# Patient Record
Sex: Male | Born: 1960 | Race: White | Hispanic: No | Marital: Single | Smoking: Never smoker
Health system: Southern US, Community
[De-identification: ages and names within clinical notes are randomized; demographics above are authoritative.]

## PROBLEM LIST (undated history)

## (undated) DIAGNOSIS — M1990 Unspecified osteoarthritis, unspecified site: Secondary | ICD-10-CM

## (undated) DIAGNOSIS — K649 Unspecified hemorrhoids: Secondary | ICD-10-CM

## (undated) DIAGNOSIS — T7840XA Allergy, unspecified, initial encounter: Secondary | ICD-10-CM

## (undated) HISTORY — DX: Unspecified osteoarthritis, unspecified site: M19.90

## (undated) HISTORY — DX: Allergy, unspecified, initial encounter: T78.40XA

---

## 2002-03-07 ENCOUNTER — Ambulatory Visit (HOSPITAL_COMMUNITY): Admission: RE | Admit: 2002-03-07 | Discharge: 2002-03-07 | Payer: Self-pay | Admitting: Orthopedic Surgery

## 2002-06-20 ENCOUNTER — Ambulatory Visit (HOSPITAL_COMMUNITY): Admission: RE | Admit: 2002-06-20 | Discharge: 2002-06-20 | Payer: Self-pay | Admitting: Orthopedic Surgery

## 2004-09-09 ENCOUNTER — Ambulatory Visit (HOSPITAL_COMMUNITY): Admission: RE | Admit: 2004-09-09 | Discharge: 2004-09-09 | Payer: Self-pay | Admitting: Family Medicine

## 2009-12-08 ENCOUNTER — Emergency Department (HOSPITAL_COMMUNITY): Admission: EM | Admit: 2009-12-08 | Discharge: 2009-12-08 | Payer: Self-pay | Admitting: Emergency Medicine

## 2011-05-01 NOTE — Op Note (Signed)
[facility]  Patient:    Jared Knight, Jared Knight Visit Number: [id number] MRN: [id number]          Service Type: DSU Location: DAY Attending Physician:  Marlowe Kays Page Dictated by:   Illene Labrador. Aplington, M.D. Proc. Date: 03/07/02 Admit Date:  03/07/2002                             Operative Report  PREOPERATIVE DIAGNOSIS:  Persistent left shoulder pain, followup post left shoulder arthroscopy with arthroscopic subacromial decompression and resection of distal left clavicle.  POSTOPERATIVE DIAGNOSIS:  Persistent left shoulder pain, followup post left shoulder arthroscopy with arthroscopic subacromial decompression and resection of distal left clavicle.  OPERATION: 1. Left shoulder arthroscopy (normal exam). 2. Repeat arthroscopic subacromial decompression. 3. Open resection of small portion of distal clavicle relieving clavicular    acromial impingement.  SURGEON:  Illene Labrador. Aplington, M.D.  ASSISTANTOttie Glazier. Wynona Neat, P.A.-C.  ANESTHESIA:  General.  PATHOLOGY AND JUSTIFICATION FOR PROCEDURE:  The patient had left shoulder elsewhere in September of 2002. He has had persistent pain in the shoulder which seems to be mainly localized to the [facility] joint and seems to be related to what I feel is an inadequate resection of the distal clavicle arthroscopically with impingement of the residual clavicle on the acromion. He also has some loss of internal rotation with some pain which I thought might possibly be due to some residual impingement. Accordingly, he is here for the above-mentioned surgery. At surgery, we found a good bit of scar in the subacromial area which I resected. I did not have to perform any additional bone resection. There was impingement of the posterior portion of the distal clavicle on the acromion and once this was resected, his impingement problem was relieved.  DESCRIPTION OF PROCEDURE:  After satisfactory general anesthesia, in  a semisitting position on the Schlein frame, the left shoulder girdle is prepped with DuraPrep and draped in a sterile field. The anatomy of the shoulder joint was marked out and lateral and posterior portals with subacromial space were infiltrated with 0.5% Marcaine with adrenaline was was the subacromial space. Through a posterior soft spot portal, I was able, with some difficulty because of scar, using a blunt trocar, to enter the glenohumeral joint, which was normal on examination; representative pictures were taken. I then redirected the scope from the subacromial area and through a lateral portal, introduced a blunt cannula followed by a 4.2 shaver, and began removing extensive fibrous tissue. I then brought in the ArthroCare vaporizer and removed additional soft tissue from around the perimeter of the anterior acromion and on the underlying surface of the acromion until I felt secure that his impingement problem had been relieved as evidenced by wide clearance on abduction of the arm. I then made an open incision over the distal clavicle and exposed subperiosteally the distal clavicle and the adjacent acromion. By bringing the arm across the chest, we could see impingement of the distal clavicle on the acromion. I protected the underneath surface of the distal clavicle. I was able to resect a small triangular portion of the distal clavicle that completely relieved the abutment of the distal clavicle on the acromion when bringing his arm across the chest. I felt then that we had concluded the operation. The wound was irrigated with sterile saline. Gelfoam was placed in the interval between the residual clavicle and the acromion. The wound was infiltrated  with 0.5% Marcaine with adrenaline. The fascia was reapproximated with interrupted #1 Vicryl, subcutaneous tissue with 2-0 Vicryl, with staples in the skin incision and in the portals. A Betadine Adaptic dry sterile dressing and a  large sling were applied. He tolerated the procedure well and was returned to recovery in satisfactory condition with no known complications. Dictated by:   Illene Labrador. Aplington, M.D. Attending Physician:  Joaquin Courts DD:  03/07/02 TD:  03/08/02 Job: [id number]

## 2011-05-01 NOTE — Op Note (Signed)
[facility]  Patient:    Jared Knight, Jared Knight Visit Number: [id number] MRN: [id number]          Service Type: DSU Location: DAY Attending Physician:  Marlowe Kays Page Dictated by:   Illene Labrador. Aplington, M.D. Proc. Date: 06/20/02 Admit Date:  06/20/2002 Discharge Date: 06/20/2002                             Operative Report  PREOPERATIVE DIAGNOSES:  Persistent right shoulder pain status post arthroscopic subacromial decompression and resection of the distal clavicle.  POSTOPERATIVE DIAGNOSES:  Persistent right shoulder pain status post arthroscopic subacromial decompression and resection of the distal clavicle.  OPERATION PERFORMED: 1. Right shoulder arthroscopy (normal examination). 2. Arthroscopic subacromial decompression. 3. Open revision of prior distal clavicle resection.  SURGEON:  Illene Labrador. Aplington, M.D.  ASSISTANT:  Clarene Reamer, P.A.-C  ANESTHESIA:  General.  PATHOLOGY AND JUSTIFICATION FOR PROCEDURE:  He had had similar procedures performed in both shoulders elsewhere. A few months ago, I revised his left shoulder with evaluation of the glenohumeral joint which was normal, repeating the subacromial decompression arthroscopically and resecting some residual distal clavicle because he had a residual impingement problem. He has done well following this left shoulder surgery. He is having a similar type of pain in his right shoulder. On a plain x-ray, it appeared that the clavicle might be riding slightly high and that he might be having continued AC joint impingement problem. In surgery, his glenohumeral joint looked normal. I did perform some additional subacromial decompression which may have been a contributing factor. On exploration of the [facility] joint, the clavicle did not appear to be elevated upward and did appear to be stable. He had some fibrous tissue in the gap from a previous resection some of which was quite firm but I did  not have to resect any additional bone from the distal clavicle. There was a sharp point to the acromion which I did smooth down with an rongeur.  DESCRIPTION OF PROCEDURE:  Satisfactory general anesthesia, beach chair position on the Schlein frame, the right shoulder girdle was prepped with Duraprep, draped in a sterile field. The anatomy of the shoulder joint was marked out and the lateral and posterior soft spots in the subacromial space were infiltrated with 0.5% Marcaine with adrenaline and I also infiltrated the proposed open AC joint incision site. Through the posterior soft spot, I was able to atraumatically enter the glenohumeral joint and found a normal looking joint with documentary pictures being taken. I then redirected the scope in the subacromial area and through a lateral portal introduced a blunt cannula followed by a 4.2 shaver and removed a small amount of fibrous tissue which was present. In general, the previous decompression appeared to be satisfactory but there were some areas that I did feel might be creating an impingement problem. I then used the arthroscope vaporizer removing soft tissue on the underneath surface of the acromion and then used a 4-0 oval bur to further bur down the underneath surface of the acromion with documented pictures showing wide decompression at the conclusion of this. I then drained fluid from the [facility] joint and opened the wound at the distal clavicle resection site. The distal clavicle was identified with a combination of cautery and periosteal dissection. I also identified the adjacent acromion. There was some thick fibrous almost bony type tissue which I removed and I smoothed down the distal acromion which  had a sharp area but I could place my index finger easily in the gap performed by the previous resection and documented pictures were taken. I then brought his arm across his chest and found no evidence for any impingement on doing  this either. Accordingly, the wound was well irrigated with sterile saline and Gelfoam was placed in the resection site gap and the fascia closed with interrupted #1 Vicryl, subcutaneous tissue with 2-0 Vicryl, Steri-Strips on the skin and 4-0 nylon in the two portals. A dry sterile dressing and sling were applied. The patient tolerated the procedure well and was taken to the recovery room in satisfactory condition with no known complications. Dictated by:   Illene Labrador. Aplington, M.D. Attending Physician:  Joaquin Courts DD:  06/20/02 TD:  06/23/02 Job: [id number]

## 2013-12-10 ENCOUNTER — Ambulatory Visit: Payer: Self-pay | Admitting: Emergency Medicine

## 2013-12-10 VITALS — BP 120/72 | HR 84 | Temp 98.7°F | Resp 16 | Ht 72.5 in | Wt 225.0 lb

## 2013-12-10 DIAGNOSIS — J209 Acute bronchitis, unspecified: Secondary | ICD-10-CM

## 2013-12-10 MED ORDER — PROMETHAZINE-CODEINE 6.25-10 MG/5ML PO SYRP: 5.0000 mL | ORAL_SOLUTION | Freq: Four times a day (QID) | ORAL | Status: DC | PRN

## 2013-12-10 MED ORDER — AZITHROMYCIN 250 MG PO TABS: ORAL_TABLET | ORAL | Status: DC

## 2013-12-10 NOTE — Patient Instructions (Signed)

## 2013-12-10 NOTE — Progress Notes (Signed)
[facility] [address] [place] [address] [phone]  Date:  12/10/2013   Name:  Jared Knight   DOB:  01-05-1961   MRN:  [id number]  PCP:  No PCP Per Patient    Chief Complaint: Fever, Headache, Sore Throat, Chest Congestion, Cough and Chills   History of Present Illness:  Jared Knight is a 52 y.o. very pleasant male patient who presents with the following:  Has cough productive purulent sputum.  Has fever three days with temp 103.  No wheezing or shortness of breath.  No nausea and vomiting.  No stool change.  Some moderation with OTC medications.  No improvement with over the counter medications or other home remedies.  Denies other complaint or health concern today.   There are no active problems to display for this patient.   Past Medical History  Diagnosis Date  . Allergy   . Arthritis     History reviewed. No pertinent past surgical history.  History  Substance Use Topics  . Smoking status: Never Smoker   . Smokeless tobacco: Not on file  . Alcohol Use: No    History reviewed. No pertinent family history.  No Known Allergies  Medication list has been reviewed and updated.  No current outpatient prescriptions on file prior to visit.   No current facility-administered medications on file prior to visit.    Review of Systems:  As per HPI, otherwise negative.    Physical Examination: Filed Vitals:   12/10/13 1505  BP: 120/72  Pulse: 84  Temp: 98.7 F (37.1 C)  Resp: 16   Filed Vitals:   12/10/13 1505  Height: 6' 0.5" (1.842 m)  Weight: 225 lb (102.059 kg)   Body mass index is 30.08 kg/(m^2). Ideal Body Weight: Weight in (lb) to have BMI = 25: 186.5  GEN: WDWN, NAD, Non-toxic, A & O x 3 HEENT: Atraumatic, Normocephalic. Neck supple. No masses, No LAD. Ears and Nose: No external deformity. CV: RRR, No M/G/R. No JVD. No thrill. No extra heart sounds. PULM: CTA B, no wheezes, crackles, rhonchi. No retractions. No resp.  distress. No accessory muscle use. ABD: S, NT, ND, +BS. No rebound. No HSM. EXTR: No c/c/e NEURO Normal gait.  PSYCH: Normally interactive. Conversant. Not depressed or anxious appearing.  Calm demeanor.    Assessment and Plan: Bronchitis zpak Phen c cod  Signed,  Phillips Odor, MD

## 2018-12-28 ENCOUNTER — Telehealth (INDEPENDENT_AMBULATORY_CARE_PROVIDER_SITE_OTHER): Payer: Self-pay | Admitting: Orthopedic Surgery

## 2018-12-28 ENCOUNTER — Ambulatory Visit (INDEPENDENT_AMBULATORY_CARE_PROVIDER_SITE_OTHER): Payer: Self-pay

## 2018-12-28 ENCOUNTER — Ambulatory Visit (INDEPENDENT_AMBULATORY_CARE_PROVIDER_SITE_OTHER): Payer: BLUE CROSS/BLUE SHIELD | Admitting: Orthopedic Surgery

## 2018-12-28 DIAGNOSIS — R102 Pelvic and perineal pain: Secondary | ICD-10-CM

## 2018-12-28 NOTE — Telephone Encounter (Signed)
Patient stated that he know you all ran out of time during his appt but requesting a gel injection to be done.  Please call patient to advise.  [phone]

## 2018-12-28 NOTE — Telephone Encounter (Signed)
Please advise and we can check benefits/auth needed.

## 2018-12-29 NOTE — Telephone Encounter (Signed)
Ok for that pls caslasl thx

## 2018-12-30 ENCOUNTER — Encounter (INDEPENDENT_AMBULATORY_CARE_PROVIDER_SITE_OTHER): Payer: Self-pay | Admitting: Orthopedic Surgery

## 2018-12-30 NOTE — Telephone Encounter (Signed)
Ok for Gel injection.  Please start process. IC patient and LM advising that we are checking VOB for him.

## 2018-12-30 NOTE — Telephone Encounter (Signed)
Noted  

## 2018-12-30 NOTE — Progress Notes (Signed)
Office Visit Note   Patient: Jared PoundsGary P Knight           Date of Birth: 1961/10/11           MRN: [id number] Visit Date: 12/28/2018 Requested by: No referring provider defined for this encounter. PCP: Patient, No Pcp Per  Subjective: Chief Complaint  Patient presents with  . Left Leg - Pain    HPI: Jared Knight is a patient with multiple and complex orthopedic issues today.  Primary among them is his concern about leg length discrepancy.  Reviewed multiple MRI and plain radiographs from prior treating physicians.  Also looked at old clinic notes.  Patient states that he had knee surgery 1988.  Has had generally bad pain in that left knee since that time.  He had plantar fasciitis in 2012 and did the normal nonoperative therapies for that.  Developed low back pain later that year and was told that he had scoliosis and only one good disc.  Really had no back problems before that.  Part of that work-up demonstrated what was thought to be a leg length discrepancy and he was given a 3/4 inch shoe lift in 2012 for that problem.  He subsequently saw a different physician in the practice and was told that he did not have a leg length discrepancy.  Also saw foot surgeon in the practice who stated that no lift was required.  He also has issues with the left knee.  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  Also reviewed the MRI scan of the left knee which shows degenerative type medial meniscal fraying but fairly significant medial compartment arthritis.  Also reviewed the MRI scan of his back which does show degenerative disc disease throughout the lumbar spine with 1 reasonably looking disc at L5-S1.              ROS: All systems reviewed are negative as they relate to the chief complaint within the history of present illness.  Patient denies  fevers or chills.   Assessment & Plan: Visit Diagnoses:  1. Pelvic pain     Plan: Impression is pelvic tilt  without definitive leg length discrepancy based on measurements and visual approximation of his standing pelvic height both with and without the shoe lift.  I think the plan on that would be a progressive diminishing effective shoe lift on the left-hand side.  The most economical way to do that would be to put a 3 essential lift on his right-hand side which would effectively cut in half the leg length on the left.  He could use that for about 6 to 8 weeks and then get a normal lift.  I did watch him walk without either Shuvon and he does not have much in terms of the limp I would expect if he had any significant leg length inequality.  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  More symptomatic and potentially problematic will be that left knee medial compartment arthritis and mildly degenerative meniscal tear.  I think most of his pain is coming from that arthritis.  He has recurrent effusion that is been aspirated on multiple occasions without much sustained relief.  I think he has to decide if he wants to get arthroscopy to address the meniscal pathology.  Eventually he may need partial knee replacement.  He wants to consider his options  on that left knee.  Plan for the brace and shoe lift was discussed.  All in all we spent about an hour talking about all these problems.  I will see him back as needed.  Follow-Up Instructions: Return if symptoms worsen or fail to improve.   Orders:  Orders Placed This Encounter  Procedures  . XR Pelvis 1-2 Views   No orders of the defined types were placed in this encounter.     Procedures: No procedures performed   Clinical Data: No additional findings.  Objective: Vital Signs: There were no vitals taken for this visit.  Physical Exam:   Constitutional: Patient appears well-developed HEENT:  Head: Normocephalic Eyes:EOM are normal Neck: Normal range of  motion Cardiovascular: Normal rate Pulmonary/chest: Effort normal Neurologic: Patient is alert Skin: Skin is warm Psychiatric: Patient has normal mood and affect    Ortho Exam: Ortho exam demonstrates pretty normal gait alignment when walking without the shoe lift.  With the shoe lift it looks about the same but may have a little bit more of a antalgic gait with the left.  Examined from behind pelvic tilt both with and without the shoe lift in the left side is definitely higher by about a centimeter with the shoe lift in place on the left.  No groin pain with internal X rotation of the leg.  Pedal pulses palpable.  He does have an effusion in the left knee which is moderate no effusion in the right knee.  Collateral crucial ligaments are stable bilaterally.  No nerve root tension signs.  Pedal pulses are palpable.  No other masses lymphadenopathy or skin changes noted in the hip or back region.  Specialty Comments:  No specialty comments available.  Imaging: No results found.   PMFS History: There are no active problems to display for this patient.  Past Medical History:  Diagnosis Date  . Allergy   . Arthritis     History reviewed. No pertinent family history.  History reviewed. No pertinent surgical history. Social History   Occupational History  . Not on file  Tobacco Use  . Smoking status: Never Smoker  Substance and Sexual Activity  . Alcohol use: No  . Drug use: No  . Sexual activity: Not on file

## 2019-01-04 ENCOUNTER — Telehealth (INDEPENDENT_AMBULATORY_CARE_PROVIDER_SITE_OTHER): Payer: Self-pay

## 2019-01-04 NOTE — Telephone Encounter (Signed)
Submitted VOB for SynviscOne, left knee. 

## 2019-01-18 ENCOUNTER — Telehealth (INDEPENDENT_AMBULATORY_CARE_PROVIDER_SITE_OTHER): Payer: Self-pay

## 2019-01-18 NOTE — Telephone Encounter (Signed)
PA required for SynviscOne, left knee. Faxed completed PA form to [employer] at [phone]. 

## 2019-01-20 ENCOUNTER — Telehealth (INDEPENDENT_AMBULATORY_CARE_PROVIDER_SITE_OTHER): Payer: Self-pay

## 2019-01-20 NOTE — Telephone Encounter (Signed)
Talked with patient and advised him that he is approved for gel injection.  Approved for SynviscOne, left knee Buy & Bill Covered at 100% through his insurance. May have a Co-pay? PA required PA Approval# [id number] Total Approved- 48.00 units Valid 01/18/2019- 01/18/2020  Appt.01/30/2019 with Dr. August Saucer

## 2019-01-20 NOTE — Telephone Encounter (Signed)
Patient has a couple of questions.  1. Would like to know if the meniscal tear needs to be addressed and if so, when?    2.Patient would also like to know if the Meniscal tear will heal itself or will the gel injection help heal the tear?    3. Will the gel injection take care of the fluid not coming back?  CB# is [phone].  Please advise.  Thank you.

## 2019-01-23 NOTE — Telephone Encounter (Signed)
I called pt and advised of message below. He has an appt 01/30/2019 and will discuss at appt.

## 2019-01-23 NOTE — Telephone Encounter (Signed)
100 percent the fluid will come back

## 2019-01-23 NOTE — Telephone Encounter (Signed)
As we discussed last office visit he has 2 pain generators in the knee one is the significant amount of medial compartment arthritis the other is that meniscal tear.  Whether or not that meniscal tear needs to be addressed really depends on whether or not he wants an intervention.  Other intervention option would be partial knee replacement.  I would not do that yet.  I think you could consider doing arthroscopy with partial medial meniscectomy with the understanding that it may not help and it may only buy him 1 or 2 years before he needs to have something else done.  In regards to whether the meniscal tear will heal itself the answer is no.  The gel injection may help the knee be less symptomatic overall.  In terms of the fluid coming back the gel injection has nothing to do with that.  I really think the main issue here is medial compartment arthritis with degenerative meniscal tear.  How bad is it bothering you.  Is enough for an intervention and if so the least invasive would be arthroscopy which may or may not help the most invasive would be medial knee replacement which would help but you do not want to do in a young person but that is a bigger operation.  And we talked about this for now her last clinic visit.

## 2019-01-23 NOTE — Telephone Encounter (Signed)
I called patient. His concern is the fluid and how much it stretches the skin. He wants to know the percent chance if he takes the fluid out and gets the gel injection, will the fluid come back? Is it okay to leave the fluid alone?  Please advise.

## 2019-01-30 ENCOUNTER — Ambulatory Visit (INDEPENDENT_AMBULATORY_CARE_PROVIDER_SITE_OTHER): Payer: BLUE CROSS/BLUE SHIELD

## 2019-01-30 ENCOUNTER — Ambulatory Visit (INDEPENDENT_AMBULATORY_CARE_PROVIDER_SITE_OTHER): Payer: BLUE CROSS/BLUE SHIELD | Admitting: Orthopedic Surgery

## 2019-01-30 ENCOUNTER — Encounter (INDEPENDENT_AMBULATORY_CARE_PROVIDER_SITE_OTHER): Payer: Self-pay | Admitting: Orthopedic Surgery

## 2019-01-30 DIAGNOSIS — M25522 Pain in left elbow: Secondary | ICD-10-CM

## 2019-02-04 ENCOUNTER — Encounter (INDEPENDENT_AMBULATORY_CARE_PROVIDER_SITE_OTHER): Payer: Self-pay | Admitting: Orthopedic Surgery

## 2019-02-04 NOTE — Progress Notes (Signed)
Office Visit Note   Patient: Jared Knight           Date of Birth: [birth year]           MRN: [id number] Visit Date: 01/30/2019 Requested by: No referring provider defined for this encounter. PCP: Patient, No Pcp Per  Subjective: Chief Complaint  Patient presents with  . Left Knee - Follow-up  . Left Elbow - Pain    HPI: [name] is a patient with left knee pain.  Has left knee arthritis and swelling.  We were going to have him get set up for Synvisc 1 injection today but he does not want that injection.  He wants to discuss the swelling in his knee.  Patient has degenerative meniscal pathology and medial compartment arthritis with swelling in the knee.  Discussed with him as we did extensively and for a long time at the last clinic visit how arthroscopy and debridement of that knee would not prevent recurrence of the swelling.  The swelling seems to be his main complaint.  He is not really having much in the way of mechanical symptoms in the knee.  He does have pretty progressive medial compartment arthritis.  He is icing 3 times a day.  He has decided against the Synvisc 1 injection.  Patient also reports left elbow pain.  He localizes pain to the medial epicondyle region.  Denies any ulnar nerve symptoms.  Takes ibuprofen.  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  Had cortisone injection injection November 2018.  Has had episodic pain since that time.              ROS: All systems reviewed are negative as they relate to the chief complaint within the history of present illness.  Patient denies  fevers or chills.   Assessment & Plan: Visit Diagnoses:  1. 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  He is in a work on Industrial/product designer for now.  We talked about the cost of PRP injection and he wants to hold off on that intervention.  In regards to the knee he is can hold off on the Synvisc 1 injection and  continue with nonoperative treatment measures.  Follow-up with me as needed  Follow-Up Instructions: Return if symptoms worsen or fail to improve.   Orders:  Orders Placed This Encounter  Procedures  . XR Elbow Complete Left (3+View)   No orders of the defined types were placed in this encounter.     Procedures: No procedures performed   Clinical Data: No additional findings.  Objective: Vital Signs: There were no vitals taken for this visit.  Physical Exam:   Constitutional: Patient appears well-developed HEENT:  Head: Normocephalic Eyes:EOM are normal Neck: Normal range of motion Cardiovascular: Normal rate Pulmonary/chest: Effort normal Neurologic: Patient is alert Skin: Skin is warm Psychiatric: Patient has normal mood and affect    Ortho Exam: Ortho exam demonstrates full range of motion of that left knee with mild effusion present.  Collateral and cruciate ligaments are stable.  Extensor mechanism is intact.  Left elbow demonstrates full range of motion with no subluxation of the ulnar nerve.  Mild tenderness to resisted pronation at the medial epicondyle.  Grip strength intact.  Radial pulse intact.  No other masses lymphadenopathy or skin changes noted in the left elbow region  Specialty Comments:  No specialty comments  available.  Imaging: No results found.   PMFS History: There are no active problems to display for this patient.  Past Medical History:  Diagnosis Date  . Allergy   . Arthritis     History reviewed. No pertinent family history.  History reviewed. No pertinent surgical history. Social History   Occupational History  . Not on file  Tobacco Use  . Smoking status: Never Smoker  . Smokeless tobacco: Never Used  Substance and Sexual Activity  . Alcohol use: No  . Drug use: No  . Sexual activity: Not on file

## 2019-02-15 ENCOUNTER — Telehealth (INDEPENDENT_AMBULATORY_CARE_PROVIDER_SITE_OTHER): Payer: Self-pay | Admitting: Orthopedic Surgery

## 2019-02-15 MED ORDER — DICLOFENAC SODIUM 1 % TD GEL: TRANSDERMAL | 1 refills | Status: DC

## 2019-02-15 NOTE — Telephone Encounter (Signed)
Patient called requesting refill Zoltaren 1% gel  Please call patient to advise. Pt has osteoarthritis.  [phone]

## 2019-02-15 NOTE — Telephone Encounter (Signed)
Submitted to [facility] in [place].

## 2019-02-17 ENCOUNTER — Telehealth (INDEPENDENT_AMBULATORY_CARE_PROVIDER_SITE_OTHER): Payer: Self-pay | Admitting: Orthopedic Surgery

## 2019-02-17 NOTE — Telephone Encounter (Signed)
Patient called stating the the Rx for Diclofenac need prior auth before [facility] in [place] will fill Rx. Patient said he has osteoarthritis. The number to contact patient is [phone]

## 2019-02-20 NOTE — Telephone Encounter (Signed)
Submitted on cover my meds

## 2019-02-22 NOTE — Telephone Encounter (Signed)
Per [email or web address] medication approved by insurance. I called patient and advised.

## 2019-03-17 ENCOUNTER — Telehealth (INDEPENDENT_AMBULATORY_CARE_PROVIDER_SITE_OTHER): Payer: Self-pay

## 2019-03-17 NOTE — Telephone Encounter (Signed)
Please advise. Thanks.  

## 2019-03-17 NOTE — Telephone Encounter (Signed)
Had cortisone injection in elbow at PCP yesterday.  Wondering if he should try some therapy.  What time frame should he wait to try something else?  Elbow is really bothering him.  [phone]

## 2019-03-20 NOTE — Telephone Encounter (Signed)
Check with person who did injection about therapy   --ok by me

## 2019-03-20 NOTE — Telephone Encounter (Signed)
IC s/w patient and advised per Dr Dean.  

## 2019-03-21 ENCOUNTER — Telehealth (HOSPITAL_COMMUNITY): Payer: Self-pay | Admitting: Specialist

## 2019-03-21 NOTE — Telephone Encounter (Signed)
Pt wanted to get in for OT elbow Lt and Rt pain, did not know he would need a referral. Will call the MD for a referral. NF 03/21/2019

## 2019-03-22 ENCOUNTER — Telehealth (HOSPITAL_COMMUNITY): Payer: Self-pay | Admitting: Specialist

## 2019-03-22 ENCOUNTER — Telehealth (HOSPITAL_COMMUNITY): Payer: Self-pay | Admitting: Occupational Therapy

## 2019-03-22 NOTE — Telephone Encounter (Signed)
Gave referral to [facility] after review she will have Verlon Au call the patient. Left Elbow pain per Dr. Kirstie Peri order in bin

## 2019-03-22 NOTE — Telephone Encounter (Signed)
Pt called to schedule I confirmed we got the referral and that a OT would call him tomorrow about scheduling.  He can be reached at [phone].

## 2019-03-30 ENCOUNTER — Other Ambulatory Visit: Payer: Self-pay

## 2019-03-30 ENCOUNTER — Ambulatory Visit (HOSPITAL_COMMUNITY): Payer: BLUE CROSS/BLUE SHIELD | Attending: Internal Medicine | Admitting: Occupational Therapy

## 2019-03-30 ENCOUNTER — Encounter (HOSPITAL_COMMUNITY): Payer: Self-pay | Admitting: Occupational Therapy

## 2019-03-30 DIAGNOSIS — M25522 Pain in left elbow: Secondary | ICD-10-CM

## 2019-03-30 DIAGNOSIS — R29898 Other symptoms and signs involving the musculoskeletal system: Secondary | ICD-10-CM | POA: Insufficient documentation

## 2019-03-30 NOTE — Therapy (Signed)
[facility] [address] [place], [place], [address] Phone: [phone]   Fax:  [phone]  Occupational Therapy Evaluation  Patient Details  Name: Jared Knight MRN: [id number] Date of Birth: 12/13/1961 Referring Provider (OT): Dr. Kirstie Peri   Encounter Date: 03/30/2019  OT End of Session - 03/30/19 1748    Visit Number  1    Number of Visits  2    Date for OT Re-Evaluation  04/29/19    Authorization Type  BC[employer]     Authorization Time Period  30 visit limit combined OT/PT/SLP; 0 used    Authorization - Visit Number  1    Authorization - Number of Visits  30    OT Start Time  1540    OT Stop Time  1640    OT Time Calculation (min)  60 min    Activity Tolerance  Patient tolerated treatment well    Behavior During Therapy  [facility] for tasks assessed/performed       Past Medical History:  Diagnosis Date  . Allergy   . Arthritis     History reviewed. No pertinent surgical history.  There were no vitals filed for this visit.  Subjective Assessment - 03/30/19 1740    Subjective   S: I had a shot then PT but the pain is coming back.     Pertinent History  Pt is a 58 y/o male presenting with left elbow pain (also reports right elbow pain) as a chronic recurring issue. 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 Pt reports attending PT at another clinic immediately prior to receiving the shot. Pt has been referred to occupational therapy by Dr. Kirstie Peri for occupational therapy evaluation and treatment.     Patient Stated Goals  To have less pain in my arms so I can work better.     Currently in Pain?  No/denies        [facility] OT Assessment - 03/30/19 1542      Assessment   Medical Diagnosis  left elbow pain    Referring Provider (OT)  Dr. Kirstie Peri    Onset Date/Surgical Date  --   Summer 2018   Hand Dominance  Right    Next MD Visit  unknown    Prior Therapy  PT  until approximately 3 weeks ago at another clinic      Precautions   Precautions  None      Restrictions   Weight Bearing Restrictions  No      Balance Screen   Has the patient fallen in the past 6 months  No    Has the patient had a decrease in activity level because of a fear of falling?   No    Is the patient reluctant to leave their home because of a fear of falling?   No      Prior Function   Level of Independence  Independent    Vocation  Full time employment    Vocation Requirements  [employer]: owns bulk vending machines: cleaning, repairs, uses hand tools a lot.     Leisure  Diplomatic Services operational officer; is a Mudlogger      ADL   ADL comments  Pt is having difficulty with office work, using hand tools, swiping on phone, lawnwork, moving items, reaching for items      Written Expression   Dominant Hand  Right  Cognition   Overall Cognitive Status  Within Functional Limits for tasks assessed      ROM / Strength   AROM / PROM / Strength  AROM;Strength      Palpation   Palpation comment  No fascial restrictions, trigger points, or muscle tightness palpated at epicondyle regions, forearm, or upper arm      AROM   Overall AROM Comments  BUE A/ROM is WNL      Strength   Overall Strength Comments  [facility]                      OT Education - 03/30/19 1747    Education Details  Epicondylitis Stretching and Strengthening Program-eccentric exercises, wrist and forearm stretches and strengthening    Person(s) Educated  Patient    Methods  Explanation;Demonstration;Verbal cues;Handout    Comprehension  Verbalized understanding;Returned demonstration       OT Short Term Goals - 03/30/19 1756      OT SHORT TERM GOAL #1   Title  Pt will be provided with and educated on HEP to improve mobility and strength required for use of LUE during ADLs.     Time  4    Period  Weeks    Status  New    Target Date  04/29/19      OT SHORT TERM GOAL #2    Title  Pt will decrease pain in LUE to 2/10 or less to improve ability to perform work tasks without significant modifications.    Time  4    Period  Weeks    Status  New      OT SHORT TERM GOAL #3   Title  Pt will improve activity tolerance in LUE by completing work jobs without needing to take more than 1 rest break per task.     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 Pt reports taking 600mg  of ibuprofen 3x/day to address the inflammation as well as completing stretches provided by previous PT. Extensive education completed on pain, what type of discomfort/pain is ok and what warrants intervention. At this time pt is taking ibuprofen anytime there is discomfort and continues to ask about exercises that cause discomfort versus pain and if he is damaging the arm. Educated pt on epicondylitis signs and symptoms, treatment POC, and expected pain. Also educated on necessity of continuing the exercises as well as incorporating rest and ice to allow for healing and strengthening. Pt questioning his line of work and condition, educated on repetitive use of tools and hardward exacerbating his condition and the need to continue with stretches as a daily routine to maintain optimal functioning. Provided pt with a variety of stretches and strengthening exercises for HEP, instructing to only complete stage I of the strengthening at this time using a red theraband. OT will check in with pt via phone call weekly to assess readiness to progress with exercises or need to problem-solve/adjust plan. Pt is agreeable.     OT Occupational Profile and History  Problem Focused Assessment - Including review of records relating to presenting problem    Occupational performance deficits (Please refer  to evaluation for details):  ADL's;IADL's;Work;Leisure    Body Structure / Function / Physical Skills  ADL;Strength;Pain;UE functional use;IADL;Endurance;Fascial restriction    Rehab Potential  Good    Clinical Decision Making  Limited treatment options, no task modification necessary    Comorbidities Affecting Occupational Performance:  None    Modification or Assistance to Complete Evaluation   No modification of tasks or assist necessary to complete eval    OT Frequency  Monthly    OT Treatment/Interventions  Self-care/ADL training;Moist Heat;Therapeutic activities;Ultrasound;Therapeutic exercise;Cryotherapy;Passive range of motion;Patient/family education;Manual Therapy;Electrical Stimulation    Plan  P: Pt will benefit from skilled OT services to decrease pain and increase strength and functional use of LUE during daily tasks. Treatment plan: HEP for stretching and strengthening, weekly follow-up phone calls, in-person visit in 1 month if necessary.    Consulted and Agree with Plan of Care  Patient       Patient will benefit from skilled therapeutic intervention in order to improve the following deficits and impairments:  Body Structure / Function / Physical Skills  Visit Diagnosis: Pain in left elbow  Other symptoms and signs involving the musculoskeletal system    Problem List There are no active problems to display for this patient.  Ezra Sites, OTR/L  [phone] 03/31/2019, 10:09 AM  [facility] [address] [place], [place], [address] Phone: [phone]   Fax:  [phone]  Name: Jared Knight MRN: [id number] Date of Birth: [birth year]

## 2019-03-30 NOTE — Patient Instructions (Signed)
Medial Epicondylitis Exercises  Explanation: Eccentric (Negative) Contractions: During these contractions, the muscles lengthen while producing force-usually by returning from a shortened (concentric) position to a resting position. For example: the lowering the weight back down during a biceps curl is an eccentric contraction for the biceps.  *For the following exercises begin with elbow bent at a 90 degree angle & resting on the knee. When therapist approves, begin performing exercises with arm straightened and no bend in the elbow.*  1) Eccentric Wrist Extension: Using band or low weight Setup . Begin sitting upright in a chair with your hand hanging off the edge of a table, palm facing down, holding one end of a resistance band that is anchored under your feet. Movement . Using your uninvolved hand lift your hand up towards the ceiling. Remove your uninvolved hand, then slowly lower your hand keeping your movements smooth and controlled.  Tip . Make sure to keep the rest of your arm relaxed and your back straight. Allow your hand to be passively lifted.        2) Eccentric Wrist Flexion: Using band or low weight Setup . Begin sitting in an upright position with one arm resting on a table, holding a dumbbell with your hand hanging off the edge. Movement . Use your other hand to curl your wrist up, then slowly lower it back down, and repeat. Tip . Make sure not to rotate your wrist, and do as little work as possible as your other hand assists in bending your wrist upward.          3) Towel Twist -Sit in a chair holding a towel with both hands, shoulders relaxed. Twist the towel with both hands in opposite directions as if you are wringing out water, hold for 3-5 seconds. Repeat 10 times then repeat another 10 times in the other direction.    4) Finger Extension .Place a thick rubber band or bands around your thumb and fingers as shown in the photo. Marland Kitchen Open your hand/fingers as  wide as possible. . Hold for 2 seconds and slowly close your fingers. . Repeat until fatigue occurs.       Epicondylitis Stretching and Strengthening Program  Handout including wrist flexor/extensor stretches, wrist flexion/extension and forearm supination/pronation strengthening, stress ball squeeze, and finger abduction stretch with rubber band

## 2019-04-06 ENCOUNTER — Telehealth (HOSPITAL_COMMUNITY): Payer: Self-pay | Admitting: Occupational Therapy

## 2019-04-06 NOTE — Telephone Encounter (Signed)
Called pt for week 1 follow up regarding HEP. Pt reports exercises are going well and he is now able to complete some tasks that he couldn't before. Pt is completing exercises 10X each, 2x/day. Discussed activity tolerance with pt, who reports fatigue after exercises but no pain. Educated on progressing to stage 2 and increasing repetitions to 15X each, continuing to complete 2x/day.  Will call again in 1 week to follow up and progress/problem-solve.    Ezra Sites, OTR/L  [phone] 04/06/2019

## 2019-04-18 ENCOUNTER — Telehealth (HOSPITAL_COMMUNITY): Payer: Self-pay | Admitting: Occupational Therapy

## 2019-04-18 NOTE — Telephone Encounter (Signed)
Called and spoke with pt to follow up on exercises. He reports stage II is going well and he is able to complete 15 repetitions with minimal fatigue. Instructed pt to progress to stage III, beginning at 10 repetitions and increasing as able. Pt reports he has also reduced pain medication to 2 ibuprofen and one pain reliever at each meal. Will follow up with pt at beginning of next week.    Ezra Sites, OTR/L  [phone] 04/18/2019

## 2019-05-02 ENCOUNTER — Encounter (HOSPITAL_COMMUNITY): Payer: Self-pay | Admitting: Occupational Therapy

## 2019-05-02 ENCOUNTER — Other Ambulatory Visit: Payer: Self-pay

## 2019-05-02 ENCOUNTER — Ambulatory Visit (HOSPITAL_COMMUNITY): Payer: BLUE CROSS/BLUE SHIELD | Attending: Internal Medicine | Admitting: Occupational Therapy

## 2019-05-02 DIAGNOSIS — R29898 Other symptoms and signs involving the musculoskeletal system: Secondary | ICD-10-CM | POA: Diagnosis present

## 2019-05-02 DIAGNOSIS — M25522 Pain in left elbow: Secondary | ICD-10-CM | POA: Insufficient documentation

## 2019-05-02 NOTE — Patient Instructions (Signed)
Strengthening Exercises: 10X each, 2X/day  1) Elbow flexion and extension Holding a _2__ pound weight, bend and straighten the elbow.      2) Elbow flexion/extenion: hammer curl Flex and extend your elbows keeping the thumb side of you arm up towards the ceiling    3) Pronated bicep curl Flex and extend your elbow keeping the back of your hand up towards the ceiling.      4) Forearm supination and pronation Holding a ___ pound weight, stabilize elbow at your side. Slowly turn forearm palm down and then palm up.

## 2019-05-03 NOTE — Therapy (Signed)
[place]ared [place], [place], [address] Phone: [phone]   Fax:  [phone]  Occupational Therapy Treatment and Discharge  Patient Details  Name: Jared Knight MRN: [id number] Date of Birth: 12/19/1960 Referring Provider (OT): Dr. Monico Blitz   Encounter Date: 05/02/2019  OT End of Session - 05/03/19 0915    Visit Number  2    Number of Visits  2    Date for OT Re-Evaluation  04/29/19    Authorization Type  BCBS     Authorization Time Period  30 visit limit combined OT/PT/SLP; 0 used    Authorization - Visit Number  2    Authorization - Number of Visits  30    OT Start Time  1640    OT Stop Time  1724    OT Time Calculation (min)  44 min    Activity Tolerance  Patient tolerated treatment well    Behavior During Therapy  [facility] for tasks assessed/performed       Past Medical History:  Diagnosis Date  . Allergy   . Arthritis     History reviewed. No pertinent surgical history.  There were no vitals filed for this visit.  Subjective Assessment - 05/02/19 1640    Subjective   S: I think it's been doing really good. I'm very pleased.     Currently in Pain?  No/denies         [facility] OT Assessment - 05/02/19 1639      Assessment   Medical Diagnosis  left elbow pain      Precautions   Precautions  None      ROM / Strength   AROM / PROM / Strength  Strength      Palpation   Palpation comment  No fascial restrictions, trigger points, or muscle tightness palpated at epicondyle regions, forearm, or upper arm      AROM   Overall AROM Comments  BUE A/ROM is WNL      Strength   Overall Strength Comments  BUE Strength is [facility]    Strength Assessment Site  Hand    Right/Left hand  Right;Left    Right Hand Grip (lbs)  115    Right Hand Lateral Pinch  26 lbs    Right Hand 3 Point Pinch  24 lbs    Left Hand Grip (lbs)  110    Left Hand Lateral Pinch  26 lbs    Left Hand 3 Point Pinch  26 lbs                OT Treatments/Exercises (OP) - 05/03/19 0908      ADLs   ADL Comments  Extensive education provided today on functioning and expected progression of elbow pain. Pt is now taking 1 ibuprofen in the am, 2 at lunch, and 2 at dinner; also using rx pain cream. Pt is not having any pain during the day and is completing exercises 3x/day. In-depth review of HEP completed, transitioned to maintenance HEP program of stretches, wrist and elbow strengthening. Pt with many questions regarding exercises, OT provided extensive, thorough education on each exercises and the frequency and duration of tasks. Also educated on expectation of soreness/discomfort in the elbow region as he continues with repetitive work tasks. Problem-solved for exercises, work tasks, and Radiation protection practitioner. questions. Pt verbalized understanding.              OT Education - 05/03/19 0914    Education Details  Transitioned on maintenance HEP program-stretches, wrist and elbow strengthening    Person(s) Educated  Patient    Methods  Explanation;Demonstration;Verbal cues;Handout    Comprehension  Verbalized understanding;Returned demonstration       OT Short Term Goals - 05/03/19 3888      OT SHORT TERM GOAL #1   Title  Pt will be provided with and educated on HEP to improve mobility and strength required for use of LUE during ADLs.     Time  4    Period  Weeks    Status  Achieved    Target Date  04/29/19      OT SHORT TERM GOAL #2   Title  Pt will decrease pain in LUE to 2/10 or less to improve ability to perform work tasks without significant modifications.    Time  4    Period  Weeks    Status  Achieved      OT SHORT TERM GOAL #3   Title  Pt will improve activity tolerance in LUE by completing work jobs without needing to take more than 1 rest break per task.     Time  4    Period  Weeks    Status  Achieved               Plan - 05/03/19 0915    Clinical Impression Statement  A: Pt returns for  follow up visit after 4 week HEP with OT providing weekly follow-up calls to adjust HEP as necessary. Pt reports no pain during ADL or work tasks, occasional mild soreness. Pt demonstrating ROM and strength WNL today. Extensive education provided for transitioning to maintenance HEP at this time. Pt asking about need for ibuprofen and is wanting to wean down. OT educated on expected soreness with continued repetitive use of the arm on a daily basis versus atypical pain associated with an epicondylitis flare which may warrant ibuprofen. Pt verbalized understanding. Pt also with questions about his back and knee, educated on obtaining a PT referral for those issues. Pt is agreeable to discharge at this time.     Body Structure / Function / Physical Skills  ADL;Strength;Pain;UE functional use;IADL;Endurance;Fascial restriction    Plan  P: Discharge pt; 1 follow up call next week to check on HEP       Patient will benefit from skilled therapeutic intervention in order to improve the following deficits and impairments:  Body Structure / Function / Physical Skills  Visit Diagnosis: Pain in left elbow  Other symptoms and signs involving the musculoskeletal system    Problem List There are no active problems to display for this patient.  Guadelupe Sabin, OTR/L  [phone] 05/03/2019, 9:24 AM  [facility] [address] [place], [place], [address] Phone: [phone]   Fax:  [phone]  Name: Jared Knight MRN: [id number] Date of Birth: Apr 19, 1961     OCCUPATIONAL THERAPY DISCHARGE SUMMARY  Visits from Start of Care: 2  Current functional level related to goals / functional outcomes: See above. Pt has met all goals and is very pleased with his functioning and pain level.    Remaining deficits: Occasional soreness with repetitive use   Education / Equipment: HEP-stretching and strengthening Plan: Patient agrees to discharge.  Patient goals  were met. Patient is being discharged due to meeting the stated rehab goals.  ?????

## 2019-05-11 ENCOUNTER — Telehealth (HOSPITAL_COMMUNITY): Payer: Self-pay | Admitting: Occupational Therapy

## 2019-05-11 NOTE — Telephone Encounter (Signed)
Spoke with pt regarding HEP provided at discharge. Pt is performing exercises without difficulty and is having little to no pain at this time. Answered pt questions regarding frequency of exercises and stretches. Pt verbalized understanding.    Ezra Sites, OTR/L  [phone] 05/11/2019

## 2019-05-11 NOTE — Telephone Encounter (Signed)
Left message to follow up on HEP. Asked pt to return call.   Ezra Sites, OTR/L  [phone] 05/11/2019

## 2019-05-11 NOTE — Telephone Encounter (Signed)
Pt returned Leslie's call - Verlon Au is with a patient and she will call him after she is done with her current pateint.

## 2020-03-04 ENCOUNTER — Ambulatory Visit (INDEPENDENT_AMBULATORY_CARE_PROVIDER_SITE_OTHER): Payer: 59 | Admitting: Orthopedic Surgery

## 2020-03-04 ENCOUNTER — Other Ambulatory Visit: Payer: Self-pay

## 2020-03-04 DIAGNOSIS — M65332 Trigger finger, left middle finger: Secondary | ICD-10-CM

## 2020-03-08 ENCOUNTER — Encounter: Payer: Self-pay | Admitting: Orthopedic Surgery

## 2020-03-08 NOTE — Progress Notes (Signed)
Office Visit Note   Patient: Jared Knight           Date of Birth: [birth year]           MRN: [id number] Visit Date: 03/04/2020 Requested by: No referring provider defined for this encounter. PCP: Patient, No Pcp Per  Subjective: Chief Complaint  Patient presents with  . Left Hand - Pain    HPI: Jared Knight is a 59 y.o. male who presents to the office complaining of left middle finger trigger finger.  Patient notes that his finger has been triggering over the last 2 months.  Happens multiple times a day but he is always able to manually reduce the finger.  This is never happened before any of his fingers.  He has been using topical Voltaren with some relief.  He denies any history of carpal tunnel syndrome, numbness/tingling, surgery on his fingers.  He has been trying to rest his finger and stretch the middle finger with icing after the stretching.  He feels that this is helping his symptoms..                ROS:  All systems reviewed are negative as they relate to the chief complaint within the history of present illness.  Patient denies fevers or chills.  Assessment & Plan: Visit Diagnoses:  1. Trigger finger, left middle finger     Plan: Patient is a 59 year old male who presents complaining of left middle finger trigger finger.  Symptoms have been present for 2 months.  Discussed options available to patient.  After discussion, patient does not want to proceed with any sort of injection for now.  Want to continue trying topical Voltaren.  He will return to the office if his symptoms worsen or do not improve and he desires an injection.  Also cautioned patient that the trigger finger may become nonreducible though this is uncommon.  He will return to the office if this happens as well.  Patient agrees with plan will follow-up as needed.  Injection will be the next step.  Follow-Up Instructions: No follow-ups on file.   Orders:  No orders of the defined types were placed in this  encounter.  No orders of the defined types were placed in this encounter.     Procedures: No procedures performed   Clinical Data: No additional findings.  Objective: Vital Signs: There were no vitals taken for this visit.  Physical Exam:  Constitutional: Patient appears well-developed HEENT:  Head: Normocephalic Eyes:EOM are normal Neck: Normal range of motion Cardiovascular: Normal rate Pulmonary/chest: Effort normal Neurologic: Patient is alert Skin: Skin is warm Psychiatric: Patient has normal mood and affect  Ortho Exam:  Left middle finger with tenderness to palpation over the A1 pulley.  No significant tenderness to palpation throughout the rest of the hand.  Triggering observed with gradual opening of a tight fist by patient.  No subluxation of extensor tendon of the left middle finger is observed.  Specialty Comments:  No specialty comments available.  Imaging: No results found.   PMFS History: There are no problems to display for this patient.  Past Medical History:  Diagnosis Date  . Allergy   . Arthritis     No family history on file.  No past surgical history on file. Social History   Occupational History  . Not on file  Tobacco Use  . Smoking status: Never Smoker  . Smokeless tobacco: Never Used  Substance and Sexual Activity  .  Alcohol use: No  . Drug use: No  . Sexual activity: Not on file

## 2020-10-02 ENCOUNTER — Ambulatory Visit (INDEPENDENT_AMBULATORY_CARE_PROVIDER_SITE_OTHER): Payer: 59

## 2020-10-02 ENCOUNTER — Ambulatory Visit (INDEPENDENT_AMBULATORY_CARE_PROVIDER_SITE_OTHER): Payer: 59 | Admitting: Orthopedic Surgery

## 2020-10-02 ENCOUNTER — Other Ambulatory Visit: Payer: Self-pay

## 2020-10-02 DIAGNOSIS — M7711 Lateral epicondylitis, right elbow: Secondary | ICD-10-CM

## 2020-10-02 MED ORDER — DICLOFENAC SODIUM 1 % EX GEL: 2.0000 g | Freq: Four times a day (QID) | CUTANEOUS | 2 refills | Status: DC

## 2020-10-02 NOTE — Progress Notes (Signed)
Office Visit Note   Patient: Jared Knight           Date of Birth: Aug 06, 1961           MRN: [id number] Visit Date: 10/02/2020 Requested by: No referring provider defined for this encounter. PCP: Patient, No Pcp Per  Subjective: Chief Complaint  Patient presents with  . trigger finger    HPI: Jared Knight is a 59 y.o. male who presents to the office complaining of Right elbow pain.  Pain bgan on 08/15/2020 without injury.  Notes insidious onset of pain.  Feels similar to the medial epicondylitis he dealt with 1-2 years ago.  He has pain with dumbbell curls.  Notes tightness in the upper forearm.  He is using ice, Voltaren gel.  Denies any weakness in the hand or numbness/tingling down the arm.  Denies any neck pain.  Denies any history of injury to the elbow.              ROS: All systems reviewed are negative as they relate to the chief complaint within the history of present illness.  Patient denies fevers or chills.  Assessment & Plan: Visit Diagnoses:  1. Right tennis elbow     Plan: Patient is a 59 year old male presents complaining of right elbow pain.  He has had elbow pain since early September.  He has a history of medial epicondylitis.  He has tenderness over the lateral epicondyle but is worse with wrist extension and grip strength testing on exam.  No sign of radial nerve compression.  Impression is lateral epicondylitis of the right elbow.  Discussed options available patient.  Plan to refer patient to [facility] therapy for 6 weeks of physical therapy.  Also will prescribe topical diclofenac.  Radiographs taken today were negative for any significant degenerative changes or fracture/dislocation.  Plan to follow-up with Dr. August Saucer in 6 weeks.  Follow-Up Instructions: No follow-ups on file.   Orders:  Orders Placed This Encounter  Procedures  . XR Elbow 2 Views Right  . Ambulatory referral to Occupational Therapy   No orders of the defined types were placed in this  encounter.     Procedures: No procedures performed   Clinical Data: No additional findings.  Objective: Vital Signs: There were no vitals taken for this visit.  Physical Exam:  Constitutional: Patient appears well-developed HEENT:  Head: Normocephalic Eyes:EOM are normal Neck: Normal range of motion Cardiovascular: Normal rate Pulmonary/chest: Effort normal Neurologic: Patient is alert Skin: Skin is warm Psychiatric: Patient has normal mood and affect  Ortho Exam: Ortho exam demonstrates right elbow without any significant swelling.  Mild to moderate tenderness over the lateral epicondyles..  No tenderness over the medial epicondyle.  No tenderness over the bicep tendon, tricep tendon, olecranon bursa, radial nerve.  Pain elicited at the elbow with resisted wrist extension, grip strength testing.  Excellent sensation throughout all dermatomes of the right upper extremity.  EPL function intact.  Specialty Comments:  No specialty comments available.  Imaging: No results found.   PMFS History: There are no problems to display for this patient.  Past Medical History:  Diagnosis Date  . Allergy   . Arthritis     No family history on file.  No past surgical history on file. Social History   Occupational History  . Not on file  Tobacco Use  . Smoking status: Never Smoker  . Smokeless tobacco: Never Used  Substance and Sexual Activity  . Alcohol use:  No  . Drug use: No  . Sexual activity: Not on file

## 2020-10-04 ENCOUNTER — Encounter (HOSPITAL_COMMUNITY): Payer: Self-pay | Admitting: Occupational Therapy

## 2020-10-04 ENCOUNTER — Ambulatory Visit (HOSPITAL_COMMUNITY): Payer: 59 | Attending: Surgical | Admitting: Occupational Therapy

## 2020-10-04 ENCOUNTER — Other Ambulatory Visit: Payer: Self-pay

## 2020-10-04 DIAGNOSIS — M25521 Pain in right elbow: Secondary | ICD-10-CM | POA: Insufficient documentation

## 2020-10-04 DIAGNOSIS — R29898 Other symptoms and signs involving the musculoskeletal system: Secondary | ICD-10-CM | POA: Diagnosis present

## 2020-10-04 NOTE — Therapy (Signed)
[facility] [address] [place], [place], [address] Phone: [phone]   Fax:  [phone]  Occupational Therapy Evaluation  Patient Details  Name: Jared Knight MRN: [id number] Date of Birth: 11-Oct-1961 Referring Provider (OT): Harriette Bouillon, [place]   Encounter Date: 10/04/2020   OT End of Session - 10/04/20 1618    Visit Number 1    Number of Visits 1    Date for OT Re-Evaluation 10/08/20    Authorization Type [employer]    Authorization Time Period 30 visit limit    Authorization - Visit Number 1    Authorization - Number of Visits 30    OT Start Time 1126   pt arrived late   OT Stop Time 1201    OT Time Calculation (min) 35 min    Activity Tolerance Patient tolerated treatment well    Behavior During Therapy [facility] for tasks assessed/performed           Past Medical History:  Diagnosis Date  . Allergy   . Arthritis     History reviewed. No pertinent surgical history.  There were no vitals filed for this visit.   Subjective Assessment - 10/04/20 1616    Subjective  S: It's just beginning to hurt sometimes, it's not nearly as bad as the other elbow was.    Pertinent History Pt is a 59 y/o male presenting with right lateral epicondylitis that began early September 2021. Pt reports onset after lifting a heavy box up from the ground. Pt was referred to occupational therapy for evaluation and treatment by Harriette Bouillon, PA.    Special Tests DASH: 15.91    Patient Stated Goals To have less soreness in my arm and elbow.    Currently in Pain? No/denies             [facility] OT Assessment - 10/04/20 1127      Assessment   Medical Diagnosis right lateral epicondylitis    Referring Provider (OT) Harriette Bouillon, PA-C    Onset Date/Surgical Date 08/15/20    Hand Dominance Right    Next MD Visit 11/20/20 with Dr. August Saucer    Prior Therapy None for this      Precautions   Precautions None      Restrictions   Weight Bearing  Restrictions No      Balance Screen   Has the patient fallen in the past 6 months Yes    How many times? 1    Has the patient had a decrease in activity level because of a fear of falling?  No    Is the patient reluctant to leave their home because of a fear of falling?  No      Prior Function   Level of Independence Independent    Vocation Full time employment    Vocation Requirements lifting, pulling, reaching    Leisure fixing up old cars      ADL   ADL comments Pt is having pain with all ADLs including anytime the elbow is bent like when combing the hair. Pt is having difficulty with lifting items and gripping things.       Written Expression   Dominant Hand Right      Cognition   Overall Cognitive Status Within Functional Limits for tasks assessed      ROM / Strength   AROM / PROM / Strength Strength      Palpation   Palpation comment Trace fascial restrictions palpated forearm  region      Strength   Strength Assessment Site Hand;Elbow;Forearm    Right/Left Elbow Right    Right Elbow Flexion 5/5    Right Elbow Extension 5/5    Right/Left Forearm Right    Right Forearm Pronation 5/5    Right Forearm Supination 5/5    Right/Left hand Right;Left    Right Hand Gross Grasp Functional    Right Hand Grip (lbs) 105    Right Hand Lateral Pinch 27 lbs    Right Hand 3 Point Pinch 25 lbs    Left Hand Gross Grasp Functional    Left Hand Grip (lbs) 85    Left Hand Lateral Pinch 25 lbs    Left Hand 3 Point Pinch 25 lbs               Neldon Mc - 10/04/20 1132    Open a tight or new jar Mild difficulty    Do heavy household chores (wash walls, wash floors) Mild difficulty    Carry a shopping bag or briefcase No difficulty    Wash your back No difficulty    Use a knife to cut food No difficulty    Recreational activities in which you take some force or impact through your arm, shoulder, or hand (golf, hammering, tennis) Moderate difficulty    During the past week, to  what extent has your arm, shoulder or hand problem interfered with your normal social activities with family, friends, neighbors, or groups? Slightly    During the past week, to what extent has your arm, shoulder or hand problem limited your work or other regular daily activities Slightly    Arm, shoulder, or hand pain. Mild    Tingling (pins and needles) in your arm, shoulder, or hand None    Difficulty Sleeping No difficulty    DASH Score 15.91 %                      OT Education - 10/04/20 1615    Education Details lateral epicondylitis program, instructions for self-massage, ice massage    Person(s) Educated Patient    Methods Explanation;Demonstration;Handout    Comprehension Verbalized understanding            OT Short Term Goals - 10/04/20 1622      OT SHORT TERM GOAL #1   Title Pt will be provided with and educated on HEP to improve mobility and strength required for use of RUE during ADLs and work tasks.    Time 1    Period Days    Status Achieved    Target Date 10/04/20                    Plan - 10/04/20 1619    Clinical Impression Statement A: Pt is a 59 y/o male presenting with lateral epicondylitis in the RUE present for approximately 6 weeks. Pt reports mild soreness after use, does not have sharp pain but wants to get ahead of the pain and hopefully prevent a full blown episode. Currently pt with strength WNL, grip and pinch strength are good. Pt provided with lateral epicondylitis program and reviewed exercises. Pt is still continuing exercises for LUE from prior therapy. Pt verbalized understanding of HEP and will call with any questions.    OT Occupational Profile and History Problem Focused Assessment - Including review of records relating to presenting problem    Occupational performance deficits (Please refer to evaluation for details): ADL's;IADL's;Work  Rehab Potential Good    Clinical Decision Making Limited treatment options, no task  modification necessary    Comorbidities Affecting Occupational Performance: None    Modification or Assistance to Complete Evaluation  No modification of tasks or assist necessary to complete eval    OT Frequency One time visit    OT Treatment/Interventions Patient/family education    Plan P: Pt verbalized understanding of HEP, no further visits necessary at this time.    OT Home Exercise Plan HEP for lateral epicondylitis    Consulted and Agree with Plan of Care Patient           Patient will benefit from skilled therapeutic intervention in order to improve the following deficits and impairments:           Visit Diagnosis: Pain in right elbow  Other symptoms and signs involving the musculoskeletal system    Problem List There are no problems to display for this patient.  Ezra Sites, OTR/L  [phone] 10/04/2020, 4:22 PM  [facility] [address] [place], [place], [address] Phone: [phone]   Fax:  [phone]  Name: NELTON AMSDEN MRN: [id number] Date of Birth: [birth year]

## 2020-10-04 NOTE — Patient Instructions (Signed)
°  Explanation: Eccentric (Negative) Contractions: During these contractions, the muscles lengthen while producing force--usually by returning from a shortened (concentric) position to a resting position. For example: the lowering the weight back down during a biceps curl is an eccentric contraction for the biceps.  *For the following exercises begin with elbow bent at a 90 degree angle & resting on the knee. When therapist approves, begin performing exercises with arm straightened and no bend in the elbow.*   1) Wrist extension: -Fix the band firmly under your foot and hold the other end in your affected hand/arm.  -Place your elbow over your knee and let your affected wrist hang towards the floor, palm down.  -Use your free hand to pull your wrist back towards you stretching the band with it. Your free hand must do all the work to bring your wrist back.  -Gently let go with the supporting hand. Slowly (over 6-8 seconds) let the band pull your wrist down towards the floor.     2) Forearm supination -Fix band firmly under foot, hold other end in affected hand/arm.  -Place elbow on knee, with affected hand/arm palm up.  -Use free hand to turn hand over so that band is tight and palm is facing down towards floor.  -Gently let go with the supporting hand and slowly (over 6-8 seconds) allow affected hand/arm to turn to a palm up position   3) Pronation -Fix band firmly under foot, hold other end in affected hand/arm.  -Place elbow on knee, with affected hand/arm palm down.  -Use free hand to turn hand over so that band is tight and palm is facing down towards ceiling.  -Gently let go with the supporting hand and slowly (over 6-8 seconds) allow affected hand/arm to turn to a palm down position    *These 3 exercises can also be performed with a weight*   4) Towel Twist -Sit in a chair holding a towel with both hands, shoulders relaxed. Twist the towel with both hands in opposite directions  as if you are wringing out water, hold for 3-5 seconds. Repeat 10 times then repeat another 10 times in the other direction.    5) Finger Extension Place a thick rubber band or bands around your thumb and fingers as shown in the photo.  Open your hand/fingers as wide as possible.  Hold for 2 seconds and slowly close your fingers.  Repeat until fatigue occurs.    Additional Stretches: 1) Wrist Extensor Stretch: While standing or sitting upright, hold your injured arm straight out in front of you and point your fingers down toward the ground. With the hand of the uninjured arm, grasp the hand of the injured arm, thumb pressing on the palm, and try to bend the wrist further towards your body.     2) Wrist Flexor Stretch: While standing or sitting upright, hold injured arm straight out in front of you with palm pointed up. With uninjured arm, grasp hand with fingers on palm and try to stretch the wrist back towards you, palm away from body.

## 2020-10-05 ENCOUNTER — Encounter: Payer: Self-pay | Admitting: Orthopedic Surgery

## 2020-11-20 ENCOUNTER — Other Ambulatory Visit: Payer: Self-pay

## 2020-11-20 ENCOUNTER — Ambulatory Visit (INDEPENDENT_AMBULATORY_CARE_PROVIDER_SITE_OTHER): Payer: 59 | Admitting: Orthopedic Surgery

## 2020-11-20 DIAGNOSIS — M7711 Lateral epicondylitis, right elbow: Secondary | ICD-10-CM | POA: Diagnosis not present

## 2020-11-23 ENCOUNTER — Encounter: Payer: Self-pay | Admitting: Orthopedic Surgery

## 2020-11-23 NOTE — Progress Notes (Signed)
   Office Visit Note   Patient: Jared Knight           Date of Birth: 10/01/1961           MRN: [id number] Visit Date: 11/20/2020 Requested by: No referring provider defined for this encounter. PCP: Patient, No Pcp Per  Subjective: Chief Complaint  Patient presents with  . 6 week follow up    HPI: Jared Knight is a 59 year old patient with right tennis elbow.  He has been seen in the past for that problem.  In general he is doing well.  Still has some mild discomfort.  Takes ibuprofen.  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  Physical therapy has helped him some.  He does do cardio every day.  Pain does not wake him from sleep at night.  He has had symptoms since September.              ROS: All systems reviewed are negative as they relate to the chief complaint within the history of present illness.  Patient denies  fevers or chills.   Assessment & Plan: Visit Diagnoses:  1. Right tennis elbow     Plan: Impression is mildly symptomatic right tennis elbow.  Patient is really doing everything in terms of physical therapy exercises cardio medications.  Using tennis elbow strap at times.  I did tell Jared Knight that sometimes this can just take a year to resolve.  He is very functional at this time.  No indication for further intervention.  Follow-up as needed.  Follow-Up Instructions: Return if symptoms worsen or fail to improve.   Orders:  No orders of the defined types were placed in this encounter.  No orders of the defined types were placed in this encounter.     Procedures: No procedures performed   Clinical Data: No additional findings.  Objective: Vital Signs: There were no vitals taken for this visit.  Physical Exam:   Constitutional: Patient appears well-developed HEENT:  Head: Normocephalic Eyes:EOM are normal Neck: Normal range of motion Cardiovascular: Normal rate Pulmonary/chest: Effort normal Neurologic: Patient is alert Skin: Skin is  warm Psychiatric: Patient has normal mood and affect    Ortho Exam: Ortho exam demonstrates full active and passive range of motion of both elbows.  5 out of 5 grip EPL FPL interosseous are/extension bicep triceps and deltoid strength.  Negative Tinel's cubital tunnel in the right elbow.  Not much in the way of pain either over the supinator arcade approach or over the medial or lateral epicondyle.  Minimal pain with wrist extension and wrist flexion.  Biceps tendon nontender.  Specialty Comments:  No specialty comments available.  Imaging: No results found.   PMFS History: There are no problems to display for this patient.  Past Medical History:  Diagnosis Date  . Allergy   . Arthritis     History reviewed. No pertinent family history.  History reviewed. No pertinent surgical history. Social History   Occupational History  . Not on file  Tobacco Use  . Smoking status: Never Smoker  . Smokeless tobacco: Never Used  Substance and Sexual Activity  . Alcohol use: No  . Drug use: No  . Sexual activity: Not on file

## 2021-02-03 ENCOUNTER — Ambulatory Visit: Payer: Self-pay

## 2021-02-03 ENCOUNTER — Ambulatory Visit (INDEPENDENT_AMBULATORY_CARE_PROVIDER_SITE_OTHER): Payer: 59 | Admitting: Orthopedic Surgery

## 2021-02-03 ENCOUNTER — Ambulatory Visit (INDEPENDENT_AMBULATORY_CARE_PROVIDER_SITE_OTHER): Payer: 59

## 2021-02-03 DIAGNOSIS — M79605 Pain in left leg: Secondary | ICD-10-CM

## 2021-02-04 ENCOUNTER — Encounter: Payer: Self-pay | Admitting: Orthopedic Surgery

## 2021-02-04 NOTE — Progress Notes (Signed)
Office Visit Note   Patient: Jared Knight           Date of Birth: [birth year]           MRN: [id number] Visit Date: 02/03/2021 Requested by: No referring provider defined for this encounter. PCP: Patient, No Pcp Per  Subjective: Chief Complaint  Patient presents with  . Left Hip - Pain  . Lower Back - Pain    HPI: Jared Knight is a 60 year old patient with left hip and back pain as well as right elbow pain the follow-up.  We had an extensive discussion about both these areas.  In general his right elbow is improving.  At most he has 2 out of 10 level pain sporadically.  He uses a wrap around his elbow.  He does do aerobic exercise as well as some resistance work with dumbbells.  Takes ibuprofen.  In general his elbow is something he can live with.  He is working with 10 pound dumbbells at this time.  He also describes left hip and back pain.  We had extensive discussion about the hip and back.  MRI scans from 2011 are reviewed.  He had some type of back injury at that time where his back locked up.  Lumbar spine MRI at that time demonstrated L4-5 foraminal encroachment on the left-hand side as well as a moderate amount of facet arthritis.  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  He does report some groin pain.              ROS: All systems reviewed are negative as they relate to the chief complaint within the history of present illness.  Patient denies  fevers or chills.   Assessment & Plan: Visit Diagnoses:  1. Pain in left leg     Plan: Impression is right hip pain which looks to be early arthritis and femoral acetabular impingement.  He may need hip replacement at sometime in the future.  Regarding the back it seems more likely that this is coming from the hip.  I think the next actions for the back and hip would be MRI scan of the back with ESI to follow and diagnostic and therapeutic intra-articular left hip injection.  He will  consider his options.  Continue with strengthening for the elbow.  Follow-up as needed.  Follow-Up Instructions: Return if symptoms worsen or fail to improve.   Orders:  Orders Placed This Encounter  Procedures  . XR HIP UNILAT W OR W/O PELVIS 2-3 VIEWS LEFT  . XR Lumbar Spine 2-3 Views   No orders of the defined types were placed in this encounter.     Procedures: No procedures performed   Clinical Data: No additional findings.  Objective: Vital Signs: There were no vitals taken for this visit.  Physical Exam:   Constitutional: Patient appears well-developed HEENT:  Head: Normocephalic Eyes:EOM are normal Neck: Normal range of motion Cardiovascular: Normal rate Pulmonary/chest: Effort normal Neurologic: Patient is alert Skin: Skin is warm Psychiatric: Patient has normal mood and affect    Ortho Exam: Ortho exam demonstrates full active and passive range of motion of the elbow.  No tenderness medial or lateral epicondyle with resisted wrist extension or flexion.  Radial pulses intact.  No other masses lymphadenopathy or skin changes noted in that right arm or elbow region.  Regarding the back he does not really have any limitation of range of motion on the  right or left hip.  Ankle dorsiflexion intact.  No paresthesias L1 S1 bilaterally.  No skin changes in the back region no trochanteric tenderness is present.  Hip flexion strength 5+ out of 5 bilaterally.  Gait is normal.  Specialty Comments:  No specialty comments available.  Imaging: No results found.   PMFS History: There are no problems to display for this patient.  Past Medical History:  Diagnosis Date  . Allergy   . Arthritis     No family history on file.  No past surgical history on file. Social History   Occupational History  . Not on file  Tobacco Use  . Smoking status: Never Smoker  . Smokeless tobacco: Never Used  Substance and Sexual Activity  . Alcohol use: No  . Drug use: No  .  Sexual activity: Not on file

## 2021-05-07 ENCOUNTER — Telehealth: Payer: Self-pay | Admitting: Orthopedic Surgery

## 2021-05-07 NOTE — Telephone Encounter (Signed)
Pt called stating he is experiencing pain and tingling in his right arm which may be connected to his previous right elbow pain that Dr. August Saucer looked at earlier in the year. Dr. August Saucer is booked up for a bit and patient only wants to see him. He asked about a possible brace for the arm that might be able to help but was unsure and asked that a nurse or Dr. August Saucer call him whenever possible. The best number is [phone].

## 2021-05-07 NOTE — Telephone Encounter (Signed)
Can I work him in?

## 2021-05-08 NOTE — Telephone Encounter (Signed)
Okay to take OTC medications until he is seen Friday.  He can make a couple notes to describe how the pain feels and the location of the pain each day until the visit which may be helpful

## 2021-05-08 NOTE — Telephone Encounter (Signed)
Pt is scheduled for next Friday. Please advise as to what to inform pt.

## 2021-05-08 NOTE — Telephone Encounter (Signed)
Lvm for pt to call to schedule work in

## 2021-05-08 NOTE — Telephone Encounter (Signed)
Added pt to sch Friday.  Pt would like to know what he needs to do for pain until Friday.   he said it is in is right forearm he seems to think it is nerve pain or he pulled a muscle.  He described the pain as a " shock " and it lingers for maybe 10 min. It isn't extreme pain  And it started about 3 weeks ago.

## 2021-05-08 NOTE — Telephone Encounter (Signed)
Okay to work in for next week thanks

## 2021-05-09 NOTE — Telephone Encounter (Signed)
Lvm for pt to call back. 

## 2021-05-16 ENCOUNTER — Encounter: Payer: Self-pay | Admitting: Orthopedic Surgery

## 2021-05-16 ENCOUNTER — Ambulatory Visit (INDEPENDENT_AMBULATORY_CARE_PROVIDER_SITE_OTHER): Payer: 59 | Admitting: Orthopedic Surgery

## 2021-05-16 DIAGNOSIS — M7711 Lateral epicondylitis, right elbow: Secondary | ICD-10-CM

## 2021-05-16 NOTE — Progress Notes (Signed)
   Office Visit Note   Patient: Jared Knight           Date of Birth: Mar 26, 1961           MRN: [id number] Visit Date: 05/16/2021 Requested by: No referring provider defined for this encounter. PCP: Patient, No Pcp Per (Inactive)  Subjective: Chief Complaint  Patient presents with  . Right Elbow - Pain    HPI: Jared Knight is a 60 year old patient with right elbow pain.  Localizing pain now to the proximal ulna region on the crest of the ulna.  Gets better with rest.  Occasionally has an area which is tender to touch.  Worse with reaching.  Been going on 3-1/2 weeks.  Ibuprofen helps and he takes it daily.  Denies any numbness and tingling in the arm.  Different spots in his lateral epicondyle which is where he gets his tennis elbow pain.              ROS: All systems reviewed are negative as they relate to the chief complaint within the history of present illness.  Patient denies  fevers or chills.   Assessment & Plan: Visit Diagnoses: No diagnosis found.  Plan: Impression is right elbow pain with some pain on the ulnar crest proximal ulna region.  I will think this is really a huge problem.  Discussed multiple other musculoskeletal complaints which are all compatible with age-related degeneration and overuse.  Overall Jared Knight is taking very good care of himself in terms of keeping himself fit and doing exercises.  These aches and pains he has are evaluated and nothing merits further intervention at this time.  I think something topical on the elbow could help short-term.  Follow-Up Instructions: No follow-ups on file.   Orders:  No orders of the defined types were placed in this encounter.  No orders of the defined types were placed in this encounter.     Procedures: No procedures performed   Clinical Data: No additional findings.  Objective: Vital Signs: There were no vitals taken for this visit.  Physical Exam:   Constitutional: Patient appears well-developed HEENT:  Head:  Normocephalic Eyes:EOM are normal Neck: Normal range of motion Cardiovascular: Normal rate Pulmonary/chest: Effort normal Neurologic: Patient is alert Skin: Skin is warm Psychiatric: Patient has normal mood and affect    Ortho Exam: Ortho exam demonstrates full active and passive range of motion of the wrist and elbow on the right-hand side.  Has some mild tenderness to palpation about 4 fingerbreadths distal to the olecranon tip right on the crest of the ulna.  Not too much tenderness in the lateral epicondyle medial epicondyle region of the distal humerus.  Elbow range of motion is full.  No masses lymphadenopathy or skin changes noted in that elbow region.  Specialty Comments:  No specialty comments available.  Imaging: No results found.   PMFS History: There are no problems to display for this patient.  Past Medical History:  Diagnosis Date  . Allergy   . Arthritis     No family history on file.  No past surgical history on file. Social History   Occupational History  . Not on file  Tobacco Use  . Smoking status: Never Smoker  . Smokeless tobacco: Never Used  Substance and Sexual Activity  . Alcohol use: No  . Drug use: No  . Sexual activity: Not on file

## 2021-07-01 ENCOUNTER — Telehealth: Payer: Self-pay | Admitting: Orthopedic Surgery

## 2021-07-01 NOTE — Telephone Encounter (Signed)
Pt called requesting a refill of diclofenac sodium. Please send to [facility]. Pt phone number is [phone].

## 2021-07-02 ENCOUNTER — Other Ambulatory Visit: Payer: Self-pay | Admitting: Surgical

## 2021-07-02 MED ORDER — DICLOFENAC SODIUM 1 % EX GEL: 2.0000 g | Freq: Four times a day (QID) | CUTANEOUS | 2 refills | Status: DC

## 2021-07-02 NOTE — Telephone Encounter (Signed)
Sent in

## 2021-07-03 NOTE — Telephone Encounter (Signed)
IC LM advising.  ?

## 2022-03-03 ENCOUNTER — Encounter (INDEPENDENT_AMBULATORY_CARE_PROVIDER_SITE_OTHER): Payer: Self-pay | Admitting: *Deleted

## 2022-04-22 ENCOUNTER — Ambulatory Visit: Payer: BC Managed Care – PPO | Admitting: Orthopedic Surgery

## 2022-05-08 ENCOUNTER — Ambulatory Visit: Payer: BC Managed Care – PPO | Admitting: Surgical

## 2022-05-08 ENCOUNTER — Ambulatory Visit (INDEPENDENT_AMBULATORY_CARE_PROVIDER_SITE_OTHER): Payer: BC Managed Care – PPO

## 2022-05-08 DIAGNOSIS — M79671 Pain in right foot: Secondary | ICD-10-CM

## 2022-05-08 DIAGNOSIS — M722 Plantar fascial fibromatosis: Secondary | ICD-10-CM | POA: Diagnosis not present

## 2022-05-09 ENCOUNTER — Ambulatory Visit
Admission: EM | Admit: 2022-05-09 | Discharge: 2022-05-09 | Disposition: A | Discharge status: Home / Self Care | Payer: BC Managed Care – PPO | Attending: Family Medicine | Admitting: Family Medicine

## 2022-05-09 ENCOUNTER — Emergency Department (HOSPITAL_COMMUNITY)
Admission: EM | Admit: 2022-05-09 | Discharge: 2022-05-09 | Discharge status: Against Medical Advice | Payer: BC Managed Care – PPO | Source: Home / Self Care

## 2022-05-09 DIAGNOSIS — S0502XA Injury of conjunctiva and corneal abrasion without foreign body, left eye, initial encounter: Secondary | ICD-10-CM

## 2022-05-09 MED ORDER — ERYTHROMYCIN 5 MG/GM OP OINT: TOPICAL_OINTMENT | OPHTHALMIC | 0 refills | Status: DC
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
## 2022-05-11 ENCOUNTER — Encounter: Payer: Self-pay | Admitting: Surgical
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
## 2022-05-11 NOTE — Progress Notes (Signed)
Office Visit Note   Patient: Jared Knight           Date of Birth: July 07, 1961           MRN: [id number] Visit Date: 05/08/2022 Requested by: Glenda Chroman, MD [address] [place],  [place] [address] PCP: Glenda Chroman, MD  Subjective: Chief Complaint  Patient presents with   Right Heel - Pain    HPI: [name] is a 61 y.o. male who presents to the office complaining of right heel pain.  He states that over the last 2 to 3 months he has had sharp pain localized to the plantar aspect of the heel particularly bothersome in the morning.  Improves throughout the day and as a becomes more mobile throughout the day.  He has tried ice and getting rid of flat shoes.  He has obtained shoes within the last 4 weeks that have more arch supports.  Denies any numbness or tingling.  No history of injury.  No history of this bothering him in the past.  He is very diligent about taking care of himself and lifts weights at home as well as riding a stationary bike..                ROS: All systems reviewed are negative as they relate to the chief complaint within the history of present illness.  Patient denies fevers or chills.  Assessment & Plan: Visit Diagnoses:  1. Plantar fasciitis of right foot     Plan: Patient is a 61 year old male who presents for evaluation of right foot pain.  Impression is plantar fasciitis.  He does note that his symptoms have been improving since he got new shoes and symptoms are not debilitating enough for any intervention at this time.  Did recommend that he incorporate heel cord stretching routine into his exercise program in order to help with resolution of this problem.  Radiographs of the right foot are negative today aside from a very small calcaneal spur.  If symptoms become more severe or refused to resolve, could consider cortisone injection but recommend he hold off on that for now given that his symptoms are steadily improving.  Follow-up with the office as needed if pain  does not improve.  Follow-Up Instructions: No follow-ups on file.   Orders:  Orders Placed This Encounter  Procedures   XR Foot Complete Right   No orders of the defined types were placed in this encounter.     Procedures: No procedures performed   Clinical Data: No additional findings.  Objective: Vital Signs: There were no vitals taken for this visit.  Physical Exam:  Constitutional: Patient appears well-developed HEENT:  Head: Normocephalic Eyes:EOM are normal Neck: Normal range of motion Cardiovascular: Normal rate Pulmonary/chest: Effort normal Neurologic: Patient is alert Skin: Skin is warm Psychiatric: Patient has normal mood and affect  Ortho Exam: Ortho exam demonstrates no swelling or ecchymosis noted.  Intact ankle dorsiflexion, plantarflexion, inversion, eversion.  No calf tenderness.  Negative Homans' sign.  No tenderness of the Achilles tendon, Achilles tendon insertion, Lisfranc complex, lateral or medial malleoli, ATFL, fifth metatarsal base.  No plantar ecchymosis noted.  There is tenderness over the medial band of the plantar fascia as well as the calcaneal insertion of the plantar fascia.  Specialty Comments:  No specialty comments available.  Imaging: No results found.   PMFS History: There are no problems to display for this patient.  Past Medical History:  Diagnosis Date  Allergy    Arthritis     No family history on file.  No past surgical history on file. Social History   Occupational History   Not on file  Tobacco Use   Smoking status: Never   Smokeless tobacco: Never  Substance and Sexual Activity   Alcohol use: No   Drug use: No   Sexual activity: Yes

## 2022-05-14 ENCOUNTER — Ambulatory Visit (INDEPENDENT_AMBULATORY_CARE_PROVIDER_SITE_OTHER): Payer: BC Managed Care – PPO | Admitting: Gastroenterology

## 2022-05-14 ENCOUNTER — Encounter (INDEPENDENT_AMBULATORY_CARE_PROVIDER_SITE_OTHER): Payer: Self-pay | Admitting: Gastroenterology

## 2022-05-15 ENCOUNTER — Encounter (INDEPENDENT_AMBULATORY_CARE_PROVIDER_SITE_OTHER): Payer: Self-pay | Admitting: *Deleted

## 2023-02-03 ENCOUNTER — Encounter (INDEPENDENT_AMBULATORY_CARE_PROVIDER_SITE_OTHER): Payer: Self-pay | Admitting: *Deleted

## 2023-02-16 ENCOUNTER — Encounter (INDEPENDENT_AMBULATORY_CARE_PROVIDER_SITE_OTHER): Payer: Self-pay | Admitting: Internal Medicine

## 2023-03-17 ENCOUNTER — Ambulatory Visit (INDEPENDENT_AMBULATORY_CARE_PROVIDER_SITE_OTHER): Payer: Medicaid Other | Admitting: Internal Medicine

## 2023-03-17 ENCOUNTER — Encounter: Payer: Self-pay | Admitting: Internal Medicine

## 2023-03-17 VITALS — BP 159/100 | HR 74 | Temp 98.1°F | Ht 72.0 in | Wt 224.3 lb

## 2023-03-17 DIAGNOSIS — R151 Fecal smearing: Secondary | ICD-10-CM

## 2023-03-17 DIAGNOSIS — K219 Gastro-esophageal reflux disease without esophagitis: Secondary | ICD-10-CM

## 2023-03-17 NOTE — Patient Instructions (Signed)
I recommend performing male pelvic floor exercises daily.  Printout provided today.  Recommend you stop eating prunes for 1 to 2 weeks and see how you do.  If no improvement, you can start taking Metamucil 1 tablespoon daily.  Can increase to 2 tablespoons daily depending how you do.  Let us know about the Cologuard testing result.  You may need a colonoscopy to further evaluate.  Follow-up in 2 to 3 months.  It was very nice meeting you today.  Dr. Abbey Chatters

## 2023-03-17 NOTE — Progress Notes (Signed)
Primary Care Physician:  Glenda Chroman, MD Primary Gastroenterologist:  Dr. Abbey Chatters  Chief Complaint  Patient presents with   Encopresis    Referred for fecal incontinence.      HPI:   Jared Knight is a 62 y.o. male who presents to clinic today by referral from his PCP Dr. Woody Seller for evaluation.  Patient states he struggles with rectal seepage.  Notes symptoms are intermittent.  Notes stool contents in his underwear at times.  Can also feel stool leaking out at times.  Worse during cold weather.  No rectal pain or discomfort.  No melena hematochezia.  States he eats a very healthy diet, lots of fruits and vegetables.  Does note eating prunes for breakfast and dinner.  Is hesitant to try fiber as he read on the instructions that he is not supposed to inhale this medication.  No previous colonoscopy.  No family history of colorectal malignancy.  Denies any upper GI symptoms including heartburn, reflux, dysphagia/odynophagia, epigastric or chest pain.  States he has a Cologuard test at home but has not submitted sample.    Past Medical History:  Diagnosis Date   Allergy    Arthritis     No past surgical history on file.  Current Outpatient Medications  Medication Sig Dispense Refill   acyclovir (ZOVIRAX) 400 MG tablet Take 400 mg by mouth 2 (two) times daily.     diphenhydrAMINE (BENADRYL) 25 mg capsule Take 25 mg by mouth at bedtime as needed.     ibuprofen (ADVIL) 200 MG tablet Take 600 mg by mouth. One bid prn     OVER THE COUNTER MEDICATION Apple cide vinegar 3 oz one time daily B complex supplement with b12 once daily Multi vit one daily Calcium 600mg  plus vit D3 20 mcg bid  Fish oil 1000 mg one bid     valsartan (DIOVAN) 40 MG tablet Take 40 mg by mouth daily.     No current facility-administered medications for this visit.    Allergies as of 03/17/2023 - Review Complete 03/17/2023  Allergen Reaction Noted   Nicotine Other (See Comments) 05/09/2022   Dust mite  extract  05/09/2022    No family history on file.  Social History   Socioeconomic History   Marital status: Single    Spouse name: Not on file   Number of children: Not on file   Years of education: Not on file   Highest education level: Not on file  Occupational History   Not on file  Tobacco Use   Smoking status: Never    Passive exposure: Never   Smokeless tobacco: Never  Substance and Sexual Activity   Alcohol use: No   Drug use: No   Sexual activity: Yes  Other Topics Concern   Not on file  Social History Narrative   Not on file   Social Determinants of Health   Financial Resource Strain: Not on file  Food Insecurity: Not on file  Transportation Needs: Not on file  Physical Activity: Not on file  Stress: Not on file  Social Connections: Not on file  Intimate Partner Violence: Not on file    Subjective: Review of Systems  Constitutional:  Negative for chills and fever.  HENT:  Negative for congestion and hearing loss.   Eyes:  Negative for blurred vision and double vision.  Respiratory:  Negative for cough and shortness of breath.   Cardiovascular:  Negative for chest pain and palpitations.  Gastrointestinal:  Negative  for abdominal pain, blood in stool, constipation, diarrhea, heartburn, melena and vomiting.  Genitourinary:  Negative for dysuria and urgency.  Musculoskeletal:  Negative for joint pain and myalgias.  Skin:  Negative for itching and rash.  Neurological:  Negative for dizziness and headaches.  Psychiatric/Behavioral:  Negative for depression. The patient is not nervous/anxious.        Objective: BP (!) 159/100   Pulse 74   Temp 98.1 F (36.7 C) (Oral)   Ht 6' (1.829 m)   Wt 224 lb 4.8 oz (101.7 kg)   BMI 30.42 kg/m  Physical Exam Constitutional:      Appearance: Normal appearance.  HENT:     Head: Normocephalic and atraumatic.  Eyes:     Extraocular Movements: Extraocular movements intact.     Conjunctiva/sclera: Conjunctivae  normal.  Cardiovascular:     Rate and Rhythm: Normal rate and regular rhythm.  Pulmonary:     Effort: Pulmonary effort is normal.     Breath sounds: Normal breath sounds.  Abdominal:     General: Bowel sounds are normal.     Palpations: Abdomen is soft.  Musculoskeletal:        General: Normal range of motion.     Cervical back: Normal range of motion and neck supple.  Skin:    General: Skin is warm.  Neurological:     General: No focal deficit present.     Mental Status: He is alert and oriented to person, place, and time.  Psychiatric:        Mood and Affect: Mood normal.        Behavior: Behavior normal.   Rectal exam: No external hemorrhoids.  Mild amount of stool smearing.  No skin excoriation or skin tags.  No anal fissures.  Good anal sphincter tone.   Assessment: *Fecal smearing *Colon cancer screening   Plan: Etiology of patient's fecal smearing unclear.  Rectal exam unremarkable today.  Recommend male pelvic floor exercises.  Printout provided today.  After diving into his diet, patient does note eating prunes both for breakfast and dinner.  Though prunes have been shown to increase stool weight, recommend he trial off prunes as this could be leading to his increased stool frequency and seepage.  If no improvement with the above, recommend he start taking Metamucil 1 to 2 tablespoons daily.  Discussed possibility of colonoscopy with patient today.  He seems rather averse to having this done.  States he has a Cologuard test at home for which she will complete and would be willing to pursue colonoscopy if positive.  Follow-up in 2 to 3 months.  03/17/2023 4:21 PM   Disclaimer: This note was dictated with voice recognition software. Similar sounding words can inadvertently be transcribed and may not be corrected upon review.

## 2023-04-13 ENCOUNTER — Ambulatory Visit (INDEPENDENT_AMBULATORY_CARE_PROVIDER_SITE_OTHER): Payer: BC Managed Care – PPO | Admitting: Gastroenterology

## 2023-05-11 ENCOUNTER — Telehealth: Payer: Self-pay

## 2023-05-11 NOTE — Telephone Encounter (Signed)
Returned the pt's call no ans/vm. Continuously rang.

## 2023-06-24 ENCOUNTER — Encounter: Payer: Self-pay | Admitting: Gastroenterology

## 2023-06-24 ENCOUNTER — Ambulatory Visit (INDEPENDENT_AMBULATORY_CARE_PROVIDER_SITE_OTHER): Payer: Medicaid Other | Admitting: Gastroenterology

## 2023-06-24 VITALS — BP 180/106 | HR 85 | Temp 97.9°F | Ht 74.0 in | Wt 228.2 lb

## 2023-06-24 DIAGNOSIS — K641 Second degree hemorrhoids: Secondary | ICD-10-CM

## 2023-06-24 NOTE — Patient Instructions (Signed)
Continue fiber, avoid straining, and limit toilet time to 2-3 minutes.  I will see you back for hemorrhoid banding!  It was a pleasure to see you today. I want to create trusting relationships with patients and provide genuine, compassionate, and quality care. I truly value your feedback, so please be on the lookout for a survey regarding your visit with me today. I appreciate your time in completing this!         [name], PhD, ANP-BC [facility]

## 2023-06-24 NOTE — Progress Notes (Signed)
Gastroenterology Office Note     Primary Care Physician:  Ignatius Specking, MD  Primary Gastroenterologist: Dr. Marletta Lor    Chief Complaint   Chief Complaint  Patient presents with   Follow-up    Follow up on fecal smearing     History of Present Illness   Jared Knight is a 62 y.o. male presenting today in follow-up with a history of fecal seepage intermittently, last seen in April 2024 to establish care as new patient. No prior colonoscopy or FH of colorectal cancer.   BP at home 126/79. Cologuard negative (Dr. Sherril Croon).   Breakfast: fried egg, oatmeal (20 ounces water), coffee, apple, 5 almonds, 6 small dark chocolates, apple cider vinegar  Lunch: orange and banana, 6 ounces coffee, green tea  Dinner: apple, some type of protein, veggies.   Used to eat a few prunes in morning and evening but has stopped. Started taking Metamucil at 1 tbsp and then increased to 2 tbsp a day. More of a nuisance. Improvement in symptoms. Almost every day. Sometimes will feel like there's a little leakage and not be anything. More mucus-like at times. Having productive BMs. No straining. No rectal bleeding. No prolapsing tissue. Bristol stool scale #4. Doing pelvic floor exercises 10 reps.   Declining colonoscopy.      Past Medical History:  Diagnosis Date   Allergy    Arthritis     No past surgical history on file.  Current Outpatient Medications  Medication Sig Dispense Refill   diphenhydrAMINE (BENADRYL) 25 mg capsule Take 25 mg by mouth at bedtime as needed.     ibuprofen (ADVIL) 200 MG tablet Take 600 mg by mouth. One bid prn     OVER THE COUNTER MEDICATION Apple cide vinegar 3 oz one time daily B complex supplement with b12 once daily Multi vit one daily Calcium 600mg  plus vit D3 20 mcg bid  Fish oil 1000 mg one bid     valsartan (DIOVAN) 40 MG tablet Take 40 mg by mouth daily.     acyclovir (ZOVIRAX) 400 MG tablet Take 400 mg by mouth 2 (two) times daily. (Patient not taking:  Reported on 06/24/2023)     No current facility-administered medications for this visit.    Allergies as of 06/24/2023 - Review Complete 06/24/2023  Allergen Reaction Noted   Nicotine Other (See Comments) 05/09/2022   Dust mite extract  05/09/2022    No family history on file.  Social History   Socioeconomic History   Marital status: Single    Spouse name: Not on file   Number of children: Not on file   Years of education: Not on file   Highest education level: Not on file  Occupational History   Not on file  Tobacco Use   Smoking status: Never    Passive exposure: Never   Smokeless tobacco: Never  Substance and Sexual Activity   Alcohol use: No   Drug use: No   Sexual activity: Yes  Other Topics Concern   Not on file  Social History Narrative   Not on file   Social Determinants of Health   Financial Resource Strain: Not on file  Food Insecurity: Not on file  Transportation Needs: Not on file  Physical Activity: Not on file  Stress: Not on file  Social Connections: Not on file  Intimate Partner Violence: Not on file     Review of Systems   Gen: Denies any fever, chills, fatigue, weight loss, lack of  appetite.  CV: Denies chest pain, heart palpitations, peripheral edema, syncope.  Resp: Denies shortness of breath at rest or with exertion. Denies wheezing or cough.  GI: Denies dysphagia or odynophagia. Denies jaundice, hematemesis, fecal incontinence. GU : Denies urinary burning, urinary frequency, urinary hesitancy MS: Denies joint pain, muscle weakness, cramps, or limitation of movement.  Derm: Denies rash, itching, dry skin Psych: Denies depression, anxiety, memory loss, and confusion Heme: Denies bruising, bleeding, and enlarged lymph nodes.   Physical Exam   BP (!) 180/106   Pulse 85   Temp 97.9 F (36.6 C)   Ht 6\' 2"  (1.88 m)   Wt 228 lb 3.2 oz (103.5 kg)   BMI 29.30 kg/m  General:   Alert and oriented. Pleasant and cooperative. Well-nourished  and well-developed.  Head:  Normocephalic and atraumatic. Eyes:  Without icterus Abdomen:  +BS, soft, non-tender and non-distended. No HSM noted. No guarding or rebound. No masses appreciated.  Rectal:  no external hemorrhoid tags. Scant amoutn of stool. Anoscopy with grade 2 internal hemorrhoids all columns. No mass.  Msk:  Symmetrical without gross deformities. Normal posture. Extremities:  Without edema. Neurologic:  Alert and  oriented x4;  grossly normal neurologically. Skin:  Intact without significant lesions or rashes. Psych:  Alert and cooperative. Normal mood and affect.   Assessment   Jared Knight is a 62 y.o. male presenting today in follow-up with a history of fecal seepage intermittently, last seen in April 2024 to establish care as new patient. No prior colonoscopy or FH of colorectal cancer.   Improvement noted with adding Metamucil. He is eager to resume prunes; we discussed he can do this cautiously as tolerated.   Anoscopy with Grade 2 internal hemorrhoids. I suspect this could be contributing to sphincter closure. I discussed at length possible benefits of banding and how I have had great results with improvement/resolution of seepage s/p banding in the past with many patients. After much discussion, he would like to pursue this.  Declining colonoscopy. He reports negative Cologuard.      PLAN    Continue Metamucil May add back prunes if desires Return for hemorrhoid banding    [name], PhD, ANP-BC [facility]enter[facility]

## 2023-07-14 ENCOUNTER — Encounter: Payer: Self-pay | Admitting: Gastroenterology

## 2023-07-14 ENCOUNTER — Ambulatory Visit (INDEPENDENT_AMBULATORY_CARE_PROVIDER_SITE_OTHER): Payer: Medicaid Other | Admitting: Gastroenterology

## 2023-07-14 VITALS — BP 157/97 | HR 65 | Temp 98.4°F | Ht 74.0 in | Wt 228.0 lb

## 2023-07-14 DIAGNOSIS — K641 Second degree hemorrhoids: Secondary | ICD-10-CM | POA: Diagnosis not present

## 2023-07-14 NOTE — Progress Notes (Signed)
    [facility] BANDING PROCEDURE NOTE  [name] is a 62 y.o. male presenting today for consideration of hemorrhoid banding. Declining colonoscopy. Soiling is main concern. He is using 1 tbsp Metamucil and 2 prunes BID for bowel regimen with good results. This has helped with fecal seepage.    The patient presents with symptomatic grade 2 hemorrhoids, unresponsive to maximal medical therapy, requesting rubber band ligation of his hemorrhoidal disease. All risks, benefits, and alternative forms of therapy were described and informed consent was obtained.  At prior visit in July, anoscopy revealed grade 2 internal hemorrhoids all columns.   The decision was made to band the left lateral internal hemorrhoid, and the [facility] O'Regan System was used to perform band ligation without complication. Digital anorectal examination was then performed to assure proper positioning of the band, and to adjust the banded tissue as required. The patient was discharged home without pain or other issues. Dietary and behavioral recommendations were given, along with follow-up instructions. The patient will return in several weeks for followup and possible additional banding as required.  No complications were encountered and the patient tolerated the procedure well.   [name], PhD, ANP-BC [facility]enter[facility]

## 2023-07-14 NOTE — Patient Instructions (Signed)
  Please avoid straining.  You should limit your toilet time to 2-3 minutes at the most.   Continue with the fiber and prunes!  Please call me with any concerns or issues!  I will see you in follow-up for additional banding in several weeks.     I enjoyed seeing you again today! I value our relationship and want to provide genuine, compassionate, and quality care. You may receive a survey regarding your visit with me, and I welcome your feedback! Thanks so much for taking the time to complete this. I look forward to seeing you again.      [name], PhD, ANP-BC [facility]

## 2023-07-15 ENCOUNTER — Inpatient Hospital Stay: Payer: Medicaid Other | Admitting: Gastroenterology

## 2023-07-15 ENCOUNTER — Encounter: Payer: Medicaid Other | Admitting: Gastroenterology

## 2023-08-05 ENCOUNTER — Encounter: Payer: Self-pay | Admitting: Gastroenterology

## 2023-08-05 ENCOUNTER — Ambulatory Visit (INDEPENDENT_AMBULATORY_CARE_PROVIDER_SITE_OTHER): Payer: Medicaid Other | Admitting: Gastroenterology

## 2023-08-05 VITALS — BP 151/91 | HR 69 | Temp 98.5°F | Ht 74.0 in | Wt 228.0 lb

## 2023-08-05 DIAGNOSIS — K641 Second degree hemorrhoids: Secondary | ICD-10-CM | POA: Diagnosis not present

## 2023-08-05 NOTE — Patient Instructions (Signed)
  Please avoid straining.  You should limit your toilet time to 2-3 minutes at the most.   I recommend Benefiber 2 teaspoons each morning in the beverage of your choice!  Please call me with any concerns or issues!  I will see you in follow-up for additional banding in several weeks.   I enjoyed seeing you again today! I value our relationship and want to provide genuine, compassionate, and quality care. You may receive a survey regarding your visit with me, and I welcome your feedback! Thanks so much for taking the time to complete this. I look forward to seeing you again.      Anna W. Boone, PhD, ANP-BC [facility]       

## 2023-08-05 NOTE — Progress Notes (Signed)
Right anterior.      [facility] BANDING PROCEDURE NOTE  Jared Knight is a 62 y.o. male presenting today for consideration of hemorrhoid banding. Declining colonoscopy. Soiling has been main concern. He has had left lateral thus far.    The patient presents with symptomatic grade 2 hemorrhoids, unresponsive to maximal medical therapy, requesting rubber band ligation of his/her hemorrhoidal disease. All risks, benefits, and alternative forms of therapy were described and informed consent was obtained.  In the left lateral decubitus position, anoscopic examination revealed grade 2 hemorrhoids in the right anterior and posterior positions. Left lateral well-healed.   The decision was made to band the right anterior internal hemorrhoid, and the [facility] O'Regan System was used to perform band ligation without complication. Digital anorectal examination was then performed to assure proper positioning of the band, and to adjust the banded tissue as required. The patient was discharged home without pain or other issues. Dietary and behavioral recommendations were given, along with follow-up instructions. The patient will return in several weeks for followup and possible additional banding as required.  No complications were encountered and the patient tolerated the procedure well.   [name], PhD, ANP-BC [facility]astroenter[facility]

## 2023-08-09 ENCOUNTER — Telehealth: Payer: Self-pay

## 2023-08-09 MED ORDER — MEDICAL COMPRESSION SOCKS MISC: 1 refills | Status: AC

## 2023-08-09 NOTE — Telephone Encounter (Signed)
I sent electronic prescription to [facility] for compression socks. Dx code per patient that has covered this in the past: I83.90.

## 2023-08-09 NOTE — Telephone Encounter (Signed)
Pt called regarding you ordering him some compression socks????

## 2023-08-09 NOTE — Addendum Note (Signed)
Addended by: [name] on: 08/09/2023 04:47 PM   Modules accepted: Orders

## 2023-08-10 ENCOUNTER — Other Ambulatory Visit: Payer: Self-pay

## 2023-08-10 NOTE — Telephone Encounter (Signed)
Phoned and LMOVM for the pt regarding Rx being sent in to [facility]

## 2023-08-10 NOTE — Telephone Encounter (Signed)
Spoke with Tobi Bastos, gave documentation from [facility] and will call pt tomorrow and have pt call his PCP

## 2023-08-10 NOTE — Telephone Encounter (Signed)
I am not sure how to do this. Can we please call [facility] and give a verbal for compression socks, one month supply, refilll prn X 1 year.

## 2023-08-10 NOTE — Progress Notes (Signed)
error 

## 2023-08-10 NOTE — Telephone Encounter (Signed)
Pt called back stating that he went to the pharmacy and you did not specify 20 or 30 compression socks so Rx was not filled. Please resend with specified 20 or 30 compression

## 2023-08-11 NOTE — Telephone Encounter (Signed)
Spoke with the pt and advised of result message. Pt is asking that if you can just right a Rx on a prescription pad. On the paper Rx use code I83.90. 20 to 30 compression socks/ knee high. Dx used: varicose veins. Please advise

## 2023-08-19 NOTE — Telephone Encounter (Signed)
Please have patient follow up with PCP. Thanks!

## 2023-08-20 NOTE — Telephone Encounter (Signed)
noted 

## 2023-08-23 NOTE — Telephone Encounter (Signed)
Phoned and advised the pt to see his PCP regarding the compression socks and the pt advises he would. Understanding was expressedd

## 2023-09-07 ENCOUNTER — Ambulatory Visit (INDEPENDENT_AMBULATORY_CARE_PROVIDER_SITE_OTHER): Payer: Medicaid Other | Admitting: Gastroenterology

## 2023-09-07 ENCOUNTER — Encounter: Payer: Self-pay | Admitting: Gastroenterology

## 2023-09-07 VITALS — BP 158/93 | HR 66 | Temp 98.2°F | Ht 74.0 in | Wt 229.2 lb

## 2023-09-07 DIAGNOSIS — K641 Second degree hemorrhoids: Secondary | ICD-10-CM | POA: Diagnosis not present

## 2023-09-07 NOTE — Progress Notes (Signed)
      [facility] BANDING PROCEDURE NOTE  Jared Knight is a 61 y.o. male presenting today for consideration of hemorrhoid banding.Declining colonoscopy. Soiling has been main concern. He has had left lateral and right anterior banding. He desires anoscopy at each visit.    The patient presents with symptomatic grade 2 hemorrhoids, unresponsive to maximal medical therapy, requesting rubber band ligation of his hemorrhoidal disease. All risks, benefits, and alternative forms of therapy were described and informed consent was obtained.  In the left lateral decubitus position, anoscopic examination revealed grade 2 hemorrhoids in the right posterior position (s).  The decision was made to band the right posterior internal hemorrhoid, and the [facility] O'Regan System was used to perform band ligation without complication. Digital anorectal examination was then performed to assure proper positioning of the band, and to adjust the banded tissue as required. The patient was discharged home without pain or other issues. Dietary and behavioral recommendations were given, along with follow-up instructions. The patient will return in several weeks for followup and possible additional banding as required.  No complications were encountered and the patient tolerated the procedure well.   [name], PhD, ANP-BC [facility]astroenter[facility]

## 2023-09-07 NOTE — Patient Instructions (Signed)
  Please avoid straining.  You should limit your toilet time to 2-3 minutes at the most.   I recommend Benefiber 2 teaspoons each morning in the beverage of your choice!  Please call me with any concerns or issues!  I will see you in follow-up on October 10th at 3pm!  I enjoyed seeing you again today! I value our relationship and want to provide genuine, compassionate, and quality care. You may receive a survey regarding your visit with me, and I welcome your feedback! Thanks so much for taking the time to complete this. I look forward to seeing you again.      [name], PhD, ANP-BC [facility]enter[facility]

## 2023-09-23 ENCOUNTER — Other Ambulatory Visit: Payer: Self-pay

## 2023-09-23 ENCOUNTER — Ambulatory Visit
Admission: EM | Admit: 2023-09-23 | Discharge: 2023-09-23 | Disposition: A | Discharge status: Home / Self Care | Payer: Medicaid Other | Attending: Nurse Practitioner | Admitting: Nurse Practitioner

## 2023-09-23 ENCOUNTER — Encounter: Payer: Self-pay | Admitting: Gastroenterology

## 2023-09-23 ENCOUNTER — Ambulatory Visit: Payer: Medicaid Other

## 2023-09-23 ENCOUNTER — Ambulatory Visit: Payer: Medicaid Other | Admitting: Gastroenterology

## 2023-09-23 ENCOUNTER — Telehealth: Payer: Self-pay | Admitting: Gastroenterology

## 2023-09-23 ENCOUNTER — Encounter: Payer: Self-pay | Admitting: Emergency Medicine

## 2023-09-23 VITALS — BP 175/110 | HR 72 | Temp 98.2°F | Ht 74.0 in | Wt 230.4 lb

## 2023-09-23 DIAGNOSIS — R208 Other disturbances of skin sensation: Secondary | ICD-10-CM | POA: Diagnosis not present

## 2023-09-23 DIAGNOSIS — M79672 Pain in left foot: Secondary | ICD-10-CM | POA: Diagnosis not present

## 2023-09-23 DIAGNOSIS — K641 Second degree hemorrhoids: Secondary | ICD-10-CM

## 2023-09-23 HISTORY — DX: Unspecified hemorrhoids: K64.9

## 2023-09-23 NOTE — ED Provider Notes (Signed)
[facility]    CSN: [id number] Arrival date & time: 09/23/23  1617      History   Chief Complaint Chief Complaint  Patient presents with   Foreign Body    HPI Jared Knight is a 62 y.o. male.   Patient presents today with concern for foreign body in his left foot.  Reports he was cleaning his car a couple weeks ago with waders on.  Thinks there may have been pebbles in the bottom of his boot but kept wearing them.  Since then, has felt like there has been metal shavings in the bottom of his foot.  He has attempted to remove the shavings with a magnet with some success.  Patient also reports pain when he walks around.  No fevers or nausea/vomiting.  No drainage from the area.    Past Medical History:  Diagnosis Date   Allergy    Arthritis    Hemorrhoids     Patient Active Problem List   Diagnosis Date Noted   Prolapsed internal hemorrhoids, grade 2 06/24/2023    History reviewed. No pertinent surgical history.     Home Medications    Prior to Admission medications   Medication Sig Start Date End Date Taking? Authorizing Provider  acyclovir (ZOVIRAX) 400 MG tablet Take 400 mg by mouth 2 (two) times daily.    [provider]  Elastic Bandages & Supports (MEDICAL COMPRESSION SOCKS) MISC Please provide medical compression socks as needed. DX code I83.90. Patient not taking: Reported on 09/23/2023 08/09/23   [name], NP  ibuprofen (ADVIL) 200 MG tablet Take 600 mg by mouth. One bid prn    [provider]  OVER THE COUNTER MEDICATION Apple cide vinegar 3 oz one time daily B complex supplement with b12 once daily Multi vit one daily Calcium 600mg  plus vit D3 20 mcg bid  Fish oil 1000 mg one bid    [provider]  valsartan (DIOVAN) 40 MG tablet Take 40 mg by mouth daily.    [provider]    Family History History reviewed. No pertinent family history.  Social History Social History   Tobacco Use   Smoking  status: Never    Passive exposure: Never   Smokeless tobacco: Never  Substance Use Topics   Alcohol use: No   Drug use: No     Allergies   Nicotine and Dust mite extract   Review of Systems Review of Systems Per HPI  Physical Exam Triage Vital Signs ED Triage Vitals  Encounter Vitals Group     BP 09/23/23 1638 (!) 167/93     Systolic BP Percentile --      Diastolic BP Percentile --      Pulse Rate 09/23/23 1638 69     Resp 09/23/23 1638 17     Temp 09/23/23 1638 98.2 F (36.8 C)     Temp Source 09/23/23 1638 Oral     SpO2 09/23/23 1638 97 %     Weight --      Height --      Head Circumference --      Peak Flow --      Pain Score 09/23/23 1643 2     Pain Loc --      Pain Education --      Exclude from Growth Chart --    No data found.  Updated Vital Signs BP (!) 167/93 (BP Location: Right Arm)   Pulse 69   Temp  98.2 F (36.8 C) (Oral)   Resp 17   SpO2 97%   Visual Acuity Right Eye Distance:   Left Eye Distance:   Bilateral Distance:    Right Eye Near:   Left Eye Near:    Bilateral Near:     Physical Exam Vitals and nursing note reviewed.  Constitutional:      General: He is not in acute distress.    Appearance: Normal appearance. He is not toxic-appearing.  Pulmonary:     Effort: Pulmonary effort is normal. No respiratory distress.  Musculoskeletal:       Feet:     Comments: Dried blood noted to the sole of the left foot in approximately area marked.  Skin appears to be callused.  No fluctuance, warmth, drainage, tenderness to touch.  Patient is neurovascularly intact distal to the injury.  Skin:    General: Skin is warm and dry.     Capillary Refill: Capillary refill takes less than 2 seconds.     Coloration: Skin is not jaundiced or pale.     Findings: No erythema.  Neurological:     Mental Status: He is alert and oriented to person, place, and time.  Psychiatric:        Behavior: Behavior is cooperative.      UC Treatments / Results   Labs (all labs ordered are listed, but only abnormal results are displayed) Labs Reviewed - No data to display  EKG   Radiology No results found.  Procedures Procedures (including critical care time)  Medications Ordered in UC Medications - No data to display  Initial Impression / Assessment and Plan / UC Course  I have reviewed the triage vital signs and the nursing notes.  Pertinent labs & imaging results that were available during my care of the patient were reviewed by me and considered in my medical decision making (see chart for details).   Patient is well-appearing, afebrile, not tachycardic, not tachypneic, oxygenating well on room air. Patient is mildly hypertensive in triage today.  1. Left foot pain 2. Sensation of foreign body in foot No red flags in history or on exam X-ray imaging reviewed by myself prior to discharge, no obvious bony abnormality seen and no radiopaque foreign body appreciated Recommended discontinuing picking it and messing with the bottom of his foot to prevent infection Start warm soaks Follow-up with podiatry if symptoms to improve with treatment  The patient was given the opportunity to ask questions.  All questions answered to their satisfaction.  The patient is in agreement to this plan.    Final Clinical Impressions(s) / UC Diagnoses   Final diagnoses:  Left foot pain  Sensation of foreign body in foot     Discharge Instructions      The xray does not show any obvious foreign body today.  Please stop picking at your foot to prevent infection.  You can soak your foot in warm water to help draw out any foreign body. Clean foot with mild soap and water.   If symptoms do not improve, you can follow up with Podiatry.    ED Prescriptions   None    PDMP not reviewed this encounter.   Valentino Nose, NP 09/23/23 [phone]

## 2023-09-23 NOTE — Telephone Encounter (Signed)
Per Tobi Bastos... if patient needs a banding if can be in any time slot with her

## 2023-09-23 NOTE — Progress Notes (Signed)
       [facility] BANDING PROCEDURE NOTE  Jared Knight is a 63 y.o. male presenting today for consideration of hemorrhoid banding. Declined colonoscopy but cologuard negative. Soiling has been main concern. This is much improved s/p banding of all three columns. Returns for possible repeat banding.    The patient presents with symptomatic grade 2 hemorrhoids, unresponsive to maximal medical therapy, requesting rubber band ligation of his hemorrhoidal disease. All risks, benefits, and alternative forms of therapy were described and informed consent was obtained.  In the left lateral decubitus position, anoscopic examination revealed grade 2 hemorrhoids in the right anterior position.  The decision was made to band the right anterior internal hemorrhoid, and the [facility] O'Regan System was used to perform band ligation without complication. Digital anorectal examination was then performed to assure proper positioning of the band, and to adjust the banded tissue as required. The patient was discharged home without pain or other issues. Dietary and behavioral recommendations were given, along with follow-up instructions. The patient will return as needed.   No complications were encountered and the patient tolerated the procedure well.   [name], PhD, ANP-BC [facility]astroenter[facility]

## 2023-09-23 NOTE — ED Triage Notes (Signed)
Pt reports possible "metal shavings" in bottom of left foot x3 weeks. Pt reports attempted to remove some from site by using magnet but reports pain with ambulation and is concerned some "fragments still remain."

## 2023-09-23 NOTE — Discharge Instructions (Addendum)
The xray does not show any obvious foreign body today.  Please stop picking at your foot to prevent infection.  You can soak your foot in warm water to help draw out any foreign body. Clean foot with mild soap and water.   If symptoms do not improve, you can follow up with Podiatry.

## 2023-09-23 NOTE — Patient Instructions (Signed)
  Please avoid straining.  You should limit your toilet time to 2-3 minutes at the most.   Please call me with any concerns or issues!  I will see you in follow-up as needed!  I enjoyed seeing you again today! I value our relationship and want to provide genuine, compassionate, and quality care. You may receive a survey regarding your visit with me, and I welcome your feedback! Thanks so much for taking the time to complete this. I look forward to seeing you again.      [name], PhD, ANP-BC [facility]enter[facility]

## 2023-10-01 ENCOUNTER — Telehealth: Payer: Self-pay | Admitting: Podiatry

## 2023-10-01 NOTE — Telephone Encounter (Signed)
Pt wants to know if he can soak his feet in warm water, he would like a call back. He is a new pt

## 2023-10-12 ENCOUNTER — Ambulatory Visit (INDEPENDENT_AMBULATORY_CARE_PROVIDER_SITE_OTHER): Payer: Medicaid Other | Admitting: Podiatry

## 2023-10-12 ENCOUNTER — Encounter: Payer: Self-pay | Admitting: Podiatry

## 2023-10-12 VITALS — Ht 74.0 in | Wt 225.0 lb

## 2023-10-12 DIAGNOSIS — L923 Foreign body granuloma of the skin and subcutaneous tissue: Secondary | ICD-10-CM

## 2023-10-12 DIAGNOSIS — I872 Venous insufficiency (chronic) (peripheral): Secondary | ICD-10-CM | POA: Diagnosis not present

## 2023-10-12 NOTE — Progress Notes (Signed)
  Subjective:  Patient ID: Jared Knight, male    DOB: 1961/10/26,  MRN: [id number]  Chief Complaint  Patient presents with   Foot Pain    Left heel pain. Started 5 weeks ago. Wearing rubber boots and believes something got in the boot. He did dig into the foot and got some type of metal shavings out. Seen at [facility] and had XR done there.     Discussed the use of AI scribe software for clinical note transcription with the patient, who gave verbal consent to proceed.  History of Present Illness   The patient presents with left heel pain that began after stepping on a foreign object while washing his car. He was wearing rubber boots at the time and felt something hurting his foot, which he describes as a common occurrence due to the nature of the boots. After the incident, he noticed persistent pain, especially when stepping on uneven surfaces like rocks. He attempted to remove what he believed to be metal shavings from the area and confirmed his suspicion using a magnet. Despite his efforts, the pain persisted, prompting a visit to urgent care where an x-ray was performed. The x-ray reportedly showed no foreign objects remaining in the foot. The patient reports that the pain is intermittent, occurring when his foot is flat or when he steps a certain way. He also mentions a history of plantar fasciitis in the same foot.          Objective:    Physical Exam   EXTREMITIES: Warm and well-perfused foot with palpable pulses and decapillary philtrum. Pain on palpation of the plantar medial heel with keratosis and evidence of attempts to dig out the area of pain. No evidence of deep puncture wound or actively draining ulceration.       No images are attached to the encounter.    Results   RADIOLOGY Foot X-ray: No definitive evidence of metal or foreign body (10/11/2023)      Assessment:   1. Venous (peripheral) insufficiency   2. Foreign body granuloma of skin and subcutaneous tissue       Plan:  Patient was evaluated and treated and all questions answered.  Assessment and Plan    Left Heel Pain   He presents with pain localized to the plantar medial heel, noting a history of foreign body sensation and self-removal of metal shavings. An X-ray from urgent care showed no retained foreign body, yet pain exacerbates when stepping on uneven surfaces, suggesting possible granuloma or scar tissue formation. We will order an ultrasound of the left heel to evaluate for retained foreign body or granuloma. Tooley's heel cup is recommended for shock absorption and pain relief, alongside continued heat or cold therapy as needed. A follow-up appointment is planned one week after the ultrasound to discuss results and consider surgical intervention or cortisone injection if necessary.  Compression Socks   He requested a prescription for 20-30 mmHg knee-high compression socks, which we will provide.          Return for after ultrasound to review.

## 2023-10-12 NOTE — Patient Instructions (Signed)
VISIT SUMMARY:  You came in today due to left heel pain that started after stepping on a foreign object while washing your car. Despite removing what you believed to be metal shavings, the pain has persisted, especially when stepping on uneven surfaces. An x-ray at urgent care showed no remaining foreign objects.  YOUR PLAN:  -LEFT HEEL PAIN: Left heel pain can be caused by various factors, including injury, inflammation, or scar tissue. We will order an ultrasound to check for any remaining foreign objects or granuloma (a small area of inflammation due to tissue injury). In the meantime, use Tooley's heel cup for shock absorption and continue with heat or cold therapy as needed. We will follow up one week after the ultrasound to discuss the results and consider further treatment options like surgery or a cortisone injection if necessary.  -COMPRESSION SOCKS: Compression socks help improve blood flow and reduce swelling. We will provide a prescription for 20-30 mmHg knee-high compression socks as requested.  INSTRUCTIONS:  Please schedule an ultrasound for your left heel and a follow-up appointment one week after the ultrasound to discuss the results. Continue using Tooley's heel cup and apply heat or cold therapy as needed. Use the prescribed compression socks as directed.

## 2023-10-13 ENCOUNTER — Telehealth: Payer: Self-pay | Admitting: Podiatry

## 2023-10-13 ENCOUNTER — Other Ambulatory Visit: Payer: Self-pay

## 2023-10-13 DIAGNOSIS — L923 Foreign body granuloma of the skin and subcutaneous tissue: Secondary | ICD-10-CM

## 2023-10-13 NOTE — Telephone Encounter (Signed)
Radiology department of [facility] @ [place] has requested a verbal order or and updated order with IMG code 5765 be placed into the system. Please also update the authorization for   Thanks

## 2023-10-21 ENCOUNTER — Ambulatory Visit (HOSPITAL_COMMUNITY)
Admission: RE | Admit: 2023-10-21 | Discharge: 2023-10-21 | Disposition: A | Discharge status: Home / Self Care | Payer: Medicaid Other | Source: Ambulatory Visit | Attending: Podiatry | Admitting: Podiatry

## 2023-10-21 ENCOUNTER — Telehealth: Payer: Self-pay | Admitting: Podiatry

## 2023-10-21 DIAGNOSIS — L923 Foreign body granuloma of the skin and subcutaneous tissue: Secondary | ICD-10-CM | POA: Diagnosis present

## 2023-10-21 NOTE — Telephone Encounter (Addendum)
Pt called stating he had his ultra sound today and I have scheduled him to see you on 11/18 and he is not wanting to wait that long. Your schedule is pretty full. I did tell him that it usually takes about a week to get results back.  He was upset that he would have to wait that long. I told him I would send you a message and see what you would recommend me to do?   Pt does not use my chart and would you please call him or text him with the results.

## 2023-11-01 ENCOUNTER — Ambulatory Visit (INDEPENDENT_AMBULATORY_CARE_PROVIDER_SITE_OTHER): Payer: Medicaid Other | Admitting: Podiatry

## 2023-11-01 DIAGNOSIS — L923 Foreign body granuloma of the skin and subcutaneous tissue: Secondary | ICD-10-CM

## 2023-11-03 ENCOUNTER — Encounter: Payer: Self-pay | Admitting: Podiatry

## 2023-11-03 NOTE — Progress Notes (Signed)
  Subjective:  Patient ID: Jared Knight, male    DOB: [birth year],  MRN: [id number]  Chief Complaint  Patient presents with   Foot Pain    RM#21 Left foot follow up had an ultrasound done about two weeks ago has had no call back yet. Patient states foot is okay minimal pain.     Discussed the use of AI scribe software for clinical note transcription with the patient, who gave verbal consent to proceed.  History of Present Illness   He returns for follow-up notes that the pain is improving and resolving      Objective:    Physical Exam   EXTREMITIES: Warm and well-perfused foot with palpable pulses and good capillary fill time.  He has no pain on palpation to the area of the previous wound.  The wound is healed.      No images are attached to the encounter.    Results   RADIOLOGY Foot X-ray: No definitive evidence of metal or foreign body (10/11/2023)      Study Result  Narrative & Impression  CLINICAL DATA:  Pain with wound.   EXAM: Ultrasound left foot soft tissues   TECHNIQUE: Ultrasound examination of the left heel soft tissues was performed in the area of clinical concern.   COMPARISON:  None Available.   FINDINGS: No mass, distortion, shadowing, fluid collection, cyst, skin thickening or foreign body is identified. There is some nonspecific focal decreased echogenicity superficial soft tissues in the area of concern which may represent edema related to the presence of a wound. No shadowing foreign body.   IMPRESSION: Nonspecific edematous changes at the site of the wound. No evidence of abscess, foreign bodies or other sonographic abnormalities.     Electronically Signed   By: Layla Maw M.D.   On: 10/23/2023 14:23   Assessment:   Encounter Diagnosis  Name Primary?   Foreign body granuloma of skin and subcutaneous tissue Yes     Plan:  Patient was evaluated and treated and all questions answered.  Assessment and Plan    Left Heel Pain    I reviewed the results of the ultrasound with him.  We discussed there is no retained foreign body.  He did have some tenderness on the heel and I discussed with him today that corticosteroid injection could alleviate the remainder of this.  He declined to do this because he was worried about having to rest after a corticosteroid shot which she has had to do before when he has had them on other areas but I told him that that would not be necessary for a low-dose in the heel today but he still declined.  Should resolve uneventfully.  Follow-up with me as needed    Return for after ultrasound to review.

## 2023-11-12 ENCOUNTER — Ambulatory Visit
Admission: EM | Admit: 2023-11-12 | Discharge: 2023-11-12 | Disposition: A | Discharge status: Home / Self Care | Payer: Medicaid Other | Attending: Family Medicine | Admitting: Family Medicine

## 2023-11-12 ENCOUNTER — Encounter: Payer: Self-pay | Admitting: Emergency Medicine

## 2023-11-12 DIAGNOSIS — J209 Acute bronchitis, unspecified: Secondary | ICD-10-CM | POA: Diagnosis not present

## 2023-11-12 MED ORDER — DEXAMETHASONE SODIUM PHOSPHATE 10 MG/ML IJ SOLN
10.0000 mg | Freq: Once | INTRAMUSCULAR | Status: AC
  Administered 2023-11-12: 10 mg via INTRAMUSCULAR

## 2023-11-12 NOTE — ED Triage Notes (Signed)
Fatigue, productive cough, SOB at times, nasal drainage x 3 week.  Has been using Flonase.

## 2023-11-12 NOTE — Discharge Instructions (Signed)
We have given you a steroid shot today.  Continue antihistamines, nasal sprays, avoid strong chemical smells or scented products that may irritate your respiratory tract.  Follow-up for worsening symptoms.

## 2023-11-12 NOTE — ED Provider Notes (Signed)
[facility]    CSN: [id number] Arrival date & time: 11/12/23  1444      History   Chief Complaint No chief complaint on file.   HPI Jared Knight is a 62 y.o. male.   Patient presenting today with 3-week history of episodic fatigue, cough, shortness of breath, nasal drainage and sinus pressure.  Denies fever, chills, chest pain, shortness of breath, abdominal pain, nausea vomiting or diarrhea.  Trying Flonase with minimal relief.  History of seasonal allergies, no known history of chronic pulmonary disease.  He states his symptoms worsen when his wife uses [employer] or they turn on the furnace.    Past Medical History:  Diagnosis Date   Allergy    Arthritis    Hemorrhoids     Patient Active Problem List   Diagnosis Date Noted   Prolapsed internal hemorrhoids, grade 2 06/24/2023    History reviewed. No pertinent surgical history.     Home Medications    Prior to Admission medications   Medication Sig Start Date End Date Taking? Authorizing Provider  acyclovir (ZOVIRAX) 400 MG tablet Take 400 mg by mouth 2 (two) times daily.    [provider]  Elastic Bandages & Supports (MEDICAL COMPRESSION SOCKS) MISC Please provide medical compression socks as needed. DX code I83.90. 08/09/23   [name], NP  ibuprofen (ADVIL) 200 MG tablet Take 600 mg by mouth. One bid prn    [provider]  OVER THE COUNTER MEDICATION Apple cide vinegar 3 oz one time daily B complex supplement with b12 once daily Multi vit one daily Calcium 600mg  plus vit D3 20 mcg bid  Fish oil 1000 mg one bid    [provider]  valsartan (DIOVAN) 40 MG tablet Take 40 mg by mouth daily.    [provider]    Family History History reviewed. No pertinent family history.  Social History Social History   Tobacco Use   Smoking status: Never    Passive exposure: Never   Smokeless tobacco: Never  Vaping Use   Vaping status: Never Used  Substance  Use Topics   Alcohol use: No   Drug use: No     Allergies   Nicotine and Dust mite extract   Review of Systems Review of Systems Per HPI  Physical Exam Triage Vital Signs ED Triage Vitals  Encounter Vitals Group     BP 11/12/23 1514 129/84     Systolic BP Percentile --      Diastolic BP Percentile --      Pulse Rate 11/12/23 1514 87     Resp 11/12/23 1514 18     Temp 11/12/23 1514 98.8 F (37.1 C)     Temp Source 11/12/23 1514 Oral     SpO2 11/12/23 1514 94 %     Weight --      Height --      Head Circumference --      Peak Flow --      Pain Score 11/12/23 1516 0     Pain Loc --      Pain Education --      Exclude from Growth Chart --    No data found.  Updated Vital Signs BP 129/84 (BP Location: Right Arm)   Pulse 87   Temp 98.8 F (37.1 C) (Oral)   Resp 18   SpO2 94%   Visual Acuity Right Eye Distance:   Left Eye Distance:   Bilateral Distance:  Right Eye Near:   Left Eye Near:    Bilateral Near:     Physical Exam Vitals and nursing note reviewed.  Constitutional:      Appearance: He is well-developed.  HENT:     Head: Atraumatic.     Right Ear: External ear normal.     Left Ear: External ear normal.     Nose: Rhinorrhea present.     Mouth/Throat:     Pharynx: No oropharyngeal exudate or posterior oropharyngeal erythema.  Eyes:     Conjunctiva/sclera: Conjunctivae normal.     Pupils: Pupils are equal, round, and reactive to light.  Cardiovascular:     Rate and Rhythm: Normal rate and regular rhythm.  Pulmonary:     Effort: Pulmonary effort is normal. No respiratory distress.     Breath sounds: No wheezing or rales.  Musculoskeletal:        General: Normal range of motion.     Cervical back: Normal range of motion and neck supple.  Lymphadenopathy:     Cervical: No cervical adenopathy.  Skin:    General: Skin is warm and dry.  Neurological:     Mental Status: He is alert and oriented to person, place, and time.  Psychiatric:         Behavior: Behavior normal.      UC Treatments / Results  Labs (all labs ordered are listed, but only abnormal results are displayed) Labs Reviewed - No data to display  EKG   Radiology No results found.  Procedures Procedures (including critical care time)  Medications Ordered in UC Medications  dexamethasone (DECADRON) injection 10 mg (10 mg Intramuscular Given 11/12/23 1625)    Initial Impression / Assessment and Plan / UC Course  I have reviewed the triage vital signs and the nursing notes.  Pertinent labs & imaging results that were available during my care of the patient were reviewed by me and considered in my medical decision making (see chart for details).     Suspect allergic bronchitis.  Treat with IM Decadron, antihistamines, Flonase, avoidance of strong fragrances and chemicals.  Return precautions reviewed.  Final Clinical Impressions(s) / UC Diagnoses   Final diagnoses:  Acute bronchitis, unspecified organism     Discharge Instructions      We have given you a steroid shot today.  Continue antihistamines, nasal sprays, avoid strong chemical smells or scented products that may irritate your respiratory tract.  Follow-up for worsening symptoms.    ED Prescriptions   None    PDMP not reviewed this encounter.   Particia Nearing, [place] 11/12/23 1704

## 2023-12-16 ENCOUNTER — Telehealth: Payer: Self-pay

## 2023-12-16 NOTE — Telephone Encounter (Signed)
 Please arrange office visit to discuss. He had been doing better when last seen.

## 2023-12-16 NOTE — Telephone Encounter (Signed)
 noted

## 2023-12-16 NOTE — Telephone Encounter (Signed)
 Pt called and stated he has had 4 bandings since April and they have not helped him. Pt wants to know what else can we do for him? Please advise

## 2024-01-04 ENCOUNTER — Ambulatory Visit: Payer: Medicaid Other | Admitting: Gastroenterology

## 2024-01-19 ENCOUNTER — Encounter: Payer: Self-pay | Admitting: Internal Medicine

## 2024-01-19 ENCOUNTER — Ambulatory Visit: Payer: Medicaid Other | Admitting: Internal Medicine

## 2024-01-19 VITALS — BP 181/111 | HR 69 | Temp 97.1°F | Ht 74.0 in | Wt 226.7 lb

## 2024-01-19 DIAGNOSIS — R151 Fecal smearing: Secondary | ICD-10-CM | POA: Diagnosis not present

## 2024-01-19 DIAGNOSIS — K219 Gastro-esophageal reflux disease without esophagitis: Secondary | ICD-10-CM

## 2024-01-19 DIAGNOSIS — K641 Second degree hemorrhoids: Secondary | ICD-10-CM

## 2024-01-19 NOTE — Progress Notes (Deleted)
    Referring Provider: Rosamond Leta NOVAK, MD Primary Care Physician:  Rosamond Leta NOVAK, MD Primary GI:  Dr. Cindie  Chief Complaint  Patient presents with   Follow-up    Patient here today due to needing a follow up on his issues with fecal smearing. He says he is taking metamucil daily.    HPI:   Jared Knight is a 63 y.o. male who presents for follow up visit. Patient   Past Medical History:  Diagnosis Date   Allergy    Arthritis    Hemorrhoids     History reviewed. No pertinent surgical history.  Current Outpatient Medications  Medication Sig Dispense Refill   acyclovir (ZOVIRAX) 400 MG tablet Take 400 mg by mouth 2 (two) times daily.     Elastic Bandages & Supports (MEDICAL COMPRESSION SOCKS) MISC Please provide medical compression socks as needed. DX code I83.90. 1 each 1   ibuprofen (ADVIL) 200 MG tablet Take 600 mg by mouth. One bid prn     OVER THE COUNTER MEDICATION Apple cide vinegar 3 oz one time daily B complex supplement with b12 once daily Multi vit one daily Calcium 600mg  plus vit D3 20 mcg bid  Fish oil 1000 mg one bid     valsartan (DIOVAN) 40 MG tablet Take 40 mg by mouth daily.     No current facility-administered medications for this visit.    Allergies as of 01/19/2024 - Review Complete 01/19/2024  Allergen Reaction Noted   Nicotine Other (See Comments) 05/09/2022   Dust mite extract  05/09/2022    History reviewed. No pertinent family history.  Social History   Socioeconomic History   Marital status: Single    Spouse name: Not on file   Number of children: Not on file   Years of education: Not on file   Highest education level: Not on file  Occupational History   Not on file  Tobacco Use   Smoking status: Never    Passive exposure: Never   Smokeless tobacco: Never  Vaping Use   Vaping status: Never Used  Substance and Sexual Activity   Alcohol use: No   Drug use: No   Sexual activity: Yes  Other Topics Concern   Not on file  Social  History Narrative   Not on file   Social Drivers of Health   Financial Resource Strain: Not on file  Food Insecurity: Not on file  Transportation Needs: Not on file  Physical Activity: Not on file  Stress: Not on file  Social Connections: Not on file    Subjective: ROS   Objective: BP (!) 181/111 (BP Location: Left Arm, Patient Position: Sitting, Cuff Size: Large)   Pulse 69   Temp (!) 97.1 F (36.2 C) (Temporal)   Ht 6' 2 (1.88 m)   Wt 226 lb 11.2 oz (102.8 kg)   BMI 29.11 kg/m  Physical Exam   Assessment: *  Plan:   01/19/2024 10:26 AM   Disclaimer: This note was dictated with voice recognition software. Similar sounding words can inadvertently be transcribed and may not be corrected upon review.

## 2024-01-19 NOTE — Progress Notes (Signed)
 Primary Care Physician:  Rosamond Leta NOVAK, MD Primary Gastroenterologist:  Dr. Cindie  Chief Complaint  Patient presents with   Follow-up    Patient here today due to needing a follow up on his issues with fecal smearing. He says he is taking metamucil daily.    HPI:   Jared Knight is a 63 y.o. male who presents to clinic today for follow-up visit.  Patient states he struggles with rectal seepage.  Notes symptoms are intermittent.  Notes stool contents in his underwear at times.  Can also feel stool leaking out at times.  Worse during cold weather.  No rectal pain or discomfort.  No melena hematochezia.  States he eats a very healthy diet, lots of fruits and vegetables.  Does note eating prunes for breakfast and dinner.   No previous colonoscopy.  Cologuard testing negative.  Has undergone hemorrhoid banding x 4 with minimal improvement in his symptoms.  States Metamucil helps his symptoms that he stopped taking this as he does not like to take fiber therapy.  He believes he is getting enough fiber through his diet.   Past Medical History:  Diagnosis Date   Allergy    Arthritis    Hemorrhoids     History reviewed. No pertinent surgical history.  Current Outpatient Medications  Medication Sig Dispense Refill   acyclovir (ZOVIRAX) 400 MG tablet Take 400 mg by mouth 2 (two) times daily.     Elastic Bandages & Supports (MEDICAL COMPRESSION SOCKS) MISC Please provide medical compression socks as needed. DX code I83.90. 1 each 1   ibuprofen (ADVIL) 200 MG tablet Take 600 mg by mouth. One bid prn     OVER THE COUNTER MEDICATION Apple cide vinegar 3 oz one time daily B complex supplement with b12 once daily Multi vit one daily Calcium 600mg  plus vit D3 20 mcg bid  Fish oil 1000 mg one bid     valsartan (DIOVAN) 40 MG tablet Take 40 mg by mouth daily.     No current facility-administered medications for this visit.    Allergies as of 01/19/2024 - Review Complete 01/19/2024   Allergen Reaction Noted   Nicotine Other (See Comments) 05/09/2022   Dust mite extract  05/09/2022    History reviewed. No pertinent family history.  Social History   Socioeconomic History   Marital status: Single    Spouse name: Not on file   Number of children: Not on file   Years of education: Not on file   Highest education level: Not on file  Occupational History   Not on file  Tobacco Use   Smoking status: Never    Passive exposure: Never   Smokeless tobacco: Never  Vaping Use   Vaping status: Never Used  Substance and Sexual Activity   Alcohol use: No   Drug use: No   Sexual activity: Yes  Other Topics Concern   Not on file  Social History Narrative   Not on file   Social Drivers of Health   Financial Resource Strain: Not on file  Food Insecurity: Not on file  Transportation Needs: Not on file  Physical Activity: Not on file  Stress: Not on file  Social Connections: Not on file  Intimate Partner Violence: Not on file    Subjective: Review of Systems  Constitutional:  Negative for chills and fever.  HENT:  Negative for congestion and hearing loss.   Eyes:  Negative for blurred vision and double vision.  Respiratory:  Negative for cough and shortness of breath.   Cardiovascular:  Negative for chest pain and palpitations.  Gastrointestinal:  Negative for abdominal pain, blood in stool, constipation, diarrhea, heartburn, melena and vomiting.  Genitourinary:  Negative for dysuria and urgency.  Musculoskeletal:  Negative for joint pain and myalgias.  Skin:  Negative for itching and rash.  Neurological:  Negative for dizziness and headaches.  Psychiatric/Behavioral:  Negative for depression. The patient is not nervous/anxious.        Objective: BP (!) 181/111 (BP Location: Left Arm, Patient Position: Sitting, Cuff Size: Large)   Pulse 69   Temp (!) 97.1 F (36.2 C) (Temporal)   Ht 6' 2 (1.88 m)   Wt 226 lb 11.2 oz (102.8 kg)   BMI 29.11 kg/m   Physical Exam Constitutional:      Appearance: Normal appearance.  HENT:     Head: Normocephalic and atraumatic.  Eyes:     Extraocular Movements: Extraocular movements intact.     Conjunctiva/sclera: Conjunctivae normal.  Cardiovascular:     Rate and Rhythm: Normal rate and regular rhythm.  Pulmonary:     Effort: Pulmonary effort is normal.     Breath sounds: Normal breath sounds.  Abdominal:     General: Bowel sounds are normal.     Palpations: Abdomen is soft.  Musculoskeletal:        General: Normal range of motion.     Cervical back: Normal range of motion and neck supple.  Skin:    General: Skin is warm.  Neurological:     General: No focal deficit present.     Mental Status: He is alert and oriented to person, place, and time.  Psychiatric:        Mood and Affect: Mood normal.        Behavior: Behavior normal.   Rectal exam: No external hemorrhoids.  No stool smearing.  No skin excoriation or skin tags.  No anal fissures.  Good anal sphincter tone.  Good squeeze.   Assessment: *Fecal smearing *Colon cancer screening   Plan: Etiology of patient's fecal smearing unclear.  Rectal exam unremarkable today.  No improvement in symptoms after hemorrhoid banding.  Counseled patient that since the Metamucil is helps the symptoms that he needs to take this every day and see how he does.  Follow-up in 4 to 6 weeks.  If not improved consider colonoscopy and/or anorectal manometry.  01/19/2024 10:45 AM   Disclaimer: This note was dictated with voice recognition software. Similar sounding words can inadvertently be transcribed and may not be corrected upon review.

## 2024-01-19 NOTE — Patient Instructions (Signed)
 I want you start getting more aggressive with your Metamucil.  Take 1 tablespoon daily and see how you do, you can slowly titrate up to 2 tablespoons depending on how you respond.  Follow-up with me in 4 to 6 weeks, if not improved we will consider further testing including anorectal manometry and possible colonoscopy.  It was very nice seeing you again today.  Dr. Cindie

## 2024-03-01 ENCOUNTER — Ambulatory Visit: Payer: Medicaid Other | Admitting: Internal Medicine

## 2024-03-01 VITALS — BP 137/83 | HR 77 | Temp 97.8°F | Ht 74.0 in | Wt 225.8 lb

## 2024-03-01 DIAGNOSIS — R151 Fecal smearing: Secondary | ICD-10-CM | POA: Diagnosis not present

## 2024-03-01 DIAGNOSIS — K219 Gastro-esophageal reflux disease without esophagitis: Secondary | ICD-10-CM

## 2024-03-01 NOTE — Patient Instructions (Signed)
 Happy to hear that you are doing better.  Will continue to uptitrate your Metamucil.  I am going to order anorectal manometry to further evaluate your symptoms.  This will need to be done at [facility].  It will likely be a few months before they get this done.  For your reflux, you can take baking soda as needed, 1/2 teaspoon in 8 ounces of water.  He could also consider taking reflux Gourmet which you can order online.    Follow-up with me in 2 to 3 months.  It is very nice seeing you again today.  Dr. Marletta Lor

## 2024-03-01 NOTE — Progress Notes (Signed)
 Primary Care Physician:  Ignatius Specking, MD Primary Gastroenterologist:  Dr. Marletta Lor  Chief Complaint  Patient presents with   Follow-up    Still having a little fecal leakage. Taking 1 1/4 tsp of metamucil.    HPI:   Jared Knight is a 63 y.o. male who presents to clinic today for follow-up visit.  Patient states he struggles with rectal seepage.  Notes symptoms are intermittent.  Notes stool contents in his underwear at times.  Can also feel stool leaking out at times.  Worse during cold weather.  No rectal pain or discomfort.  No melena hematochezia.  States he eats a very healthy diet, lots of fruits and vegetables.  Does note eating prunes for breakfast and dinner.   No previous colonoscopy.  Cologuard testing negative.  Has undergone hemorrhoid banding x 4 with minimal improvement in his symptoms.  States Metamucil helps his symptoms.  He is slowly titrating this up and doing better.   Past Medical History:  Diagnosis Date   Allergy    Arthritis    Hemorrhoids     No past surgical history on file.  Current Outpatient Medications  Medication Sig Dispense Refill   acyclovir (ZOVIRAX) 400 MG tablet Take 400 mg by mouth 2 (two) times daily.     Elastic Bandages & Supports (MEDICAL COMPRESSION SOCKS) MISC Please provide medical compression socks as needed. DX code I83.90. 1 each 1   ibuprofen (ADVIL) 200 MG tablet Take 600 mg by mouth. One bid prn     psyllium (METAMUCIL) 58.6 % powder Take 1 packet by mouth daily. Taking 1 1/4 tsp daily     valsartan (DIOVAN) 40 MG tablet Take 40 mg by mouth daily.     OVER THE COUNTER MEDICATION Apple cide vinegar 3 oz one time daily B complex supplement with b12 once daily Multi vit one daily Calcium 600mg  plus vit D3 20 mcg bid  Fish oil 1000 mg one bid     No current facility-administered medications for this visit.    Allergies as of 03/01/2024 - Review Complete 03/01/2024  Allergen Reaction Noted   Nicotine Other (See Comments)  05/09/2022   Dust mite extract  05/09/2022    No family history on file.  Social History   Socioeconomic History   Marital status: Single    Spouse name: Not on file   Number of children: Not on file   Years of education: Not on file   Highest education level: Not on file  Occupational History   Not on file  Tobacco Use   Smoking status: Never    Passive exposure: Never   Smokeless tobacco: Never  Vaping Use   Vaping status: Never Used  Substance and Sexual Activity   Alcohol use: No   Drug use: No   Sexual activity: Yes  Other Topics Concern   Not on file  Social History Narrative   Not on file   Social Drivers of Health   Financial Resource Strain: Not on file  Food Insecurity: Not on file  Transportation Needs: Not on file  Physical Activity: Not on file  Stress: Not on file  Social Connections: Not on file  Intimate Partner Violence: Not on file    Subjective: Review of Systems  Constitutional:  Negative for chills and fever.  HENT:  Negative for congestion and hearing loss.   Eyes:  Negative for blurred vision and double vision.  Respiratory:  Negative for cough and shortness of  breath.   Cardiovascular:  Negative for chest pain and palpitations.  Gastrointestinal:  Negative for abdominal pain, blood in stool, constipation, diarrhea, heartburn, melena and vomiting.  Genitourinary:  Negative for dysuria and urgency.  Musculoskeletal:  Negative for joint pain and myalgias.  Skin:  Negative for itching and rash.  Neurological:  Negative for dizziness and headaches.  Psychiatric/Behavioral:  Negative for depression. The patient is not nervous/anxious.        Objective: BP 137/83 (BP Location: Left Arm, Patient Position: Sitting, Cuff Size: Large)   Pulse 77   Temp 97.8 F (36.6 C) (Oral)   Ht 6\' 2"  (1.88 m)   Wt 225 lb 12.8 oz (102.4 kg)   BMI 28.99 kg/m  Physical Exam Constitutional:      Appearance: Normal appearance.  HENT:     Head:  Normocephalic and atraumatic.  Eyes:     Extraocular Movements: Extraocular movements intact.     Conjunctiva/sclera: Conjunctivae normal.  Cardiovascular:     Rate and Rhythm: Normal rate and regular rhythm.  Pulmonary:     Effort: Pulmonary effort is normal.     Breath sounds: Normal breath sounds.  Abdominal:     General: Bowel sounds are normal.     Palpations: Abdomen is soft.  Musculoskeletal:        General: Normal range of motion.     Cervical back: Normal range of motion and neck supple.  Skin:    General: Skin is warm.  Neurological:     General: No focal deficit present.     Mental Status: He is alert and oriented to person, place, and time.  Psychiatric:        Mood and Affect: Mood normal.        Behavior: Behavior normal.      Assessment: *Fecal smearing *Colon cancer screening   Plan: Etiology of patient's fecal smearing unclear.  Rectal exam unremarkable today.  No improvement in symptoms after hemorrhoid banding.  Continue to uptitrate Metamucil as this seems to be helping.  Patient is interested in anorectal manometry which I will order today.  Follow-up in 2 to 3 months.  03/01/2024 3:26 PM   Disclaimer: This note was dictated with voice recognition software. Similar sounding words can inadvertently be transcribed and may not be corrected upon review.

## 2024-03-08 ENCOUNTER — Other Ambulatory Visit: Payer: Self-pay | Admitting: *Deleted

## 2024-03-08 DIAGNOSIS — R151 Fecal smearing: Secondary | ICD-10-CM

## 2024-05-04 ENCOUNTER — Ambulatory Visit
Admission: EM | Admit: 2024-05-04 | Discharge: 2024-05-04 | Disposition: A | Discharge status: Home / Self Care | Attending: Family Medicine | Admitting: Family Medicine

## 2024-05-04 DIAGNOSIS — K121 Other forms of stomatitis: Secondary | ICD-10-CM

## 2024-05-04 MED ORDER — CHLORHEXIDINE GLUCONATE 0.12 % MT SOLN: 15.0000 mL | Freq: Two times a day (BID) | OROMUCOSAL | 0 refills | Status: AC

## 2024-05-04 NOTE — ED Provider Notes (Signed)
 [facility]    CSN: [id number] Arrival date & time: 05/04/24  1840      History   Chief Complaint No chief complaint on file.   HPI Jared Knight is a 63 y.o. male.   Patient presenting today with mouth ulcers to the inner aspect of the lower lip for about a month now.  Denies drainage, bleeding, progression of ulceration, fevers, difficulty breathing or swallowing.  Has tried peroxide, mouthwashes and some leftover Magic mouthwash that his wife had with minimal relief.    Past Medical History:  Diagnosis Date   Allergy    Arthritis    Hemorrhoids     Patient Active Problem List   Diagnosis Date Noted   Prolapsed internal hemorrhoids, grade 2 06/24/2023    History reviewed. No pertinent surgical history.     Home Medications    Prior to Admission medications   Medication Sig Start Date End Date Taking? Authorizing Provider  chlorhexidine (PERIDEX) 0.12 % solution Use as directed 15 mLs in the mouth or throat 2 (two) times daily. 05/04/24  Yes Corbin Dess, PA-C  acyclovir (ZOVIRAX) 400 MG tablet Take 400 mg by mouth 2 (two) times daily.    [provider]  Elastic Bandages & Supports (MEDICAL COMPRESSION SOCKS) MISC Please provide medical compression socks as needed. DX code I83.90. 08/09/23   Delman Ferns, NP  ibuprofen (ADVIL) 200 MG tablet Take 600 mg by mouth. One bid prn    [provider]  OVER THE COUNTER MEDICATION Apple cide vinegar 3 oz one time daily B complex supplement with b12 once daily Multi vit one daily Calcium 600mg  plus vit D3 20 mcg bid  Fish oil 1000 mg one bid    [provider]  psyllium (METAMUCIL) 58.6 % powder Take 1 packet by mouth daily. Taking 1 1/4 tsp daily    [provider]  valsartan (DIOVAN) 40 MG tablet Take 40 mg by mouth daily.    [provider]    Family History History reviewed. No pertinent family history.  Social History Social History   Tobacco Use    Smoking status: Never    Passive exposure: Never   Smokeless tobacco: Never  Vaping Use   Vaping status: Never Used  Substance Use Topics   Alcohol use: No   Drug use: No     Allergies   Nicotine and Dust mite extract   Review of Systems Review of Systems Per HPI  Physical Exam Triage Vital Signs ED Triage Vitals  Encounter Vitals Group     BP 05/04/24 1848 (!) 185/105     Systolic BP Percentile --      Diastolic BP Percentile --      Pulse Rate 05/04/24 1848 78     Resp 05/04/24 1848 16     Temp 05/04/24 1848 98.4 F (36.9 C)     Temp Source 05/04/24 1848 Oral     SpO2 05/04/24 1848 97 %     Weight --      Height --      Head Circumference --      Peak Flow --      Pain Score 05/04/24 1850 0     Pain Loc --      Pain Education --      Exclude from Growth Chart --    No data found.  Updated Vital Signs BP (!) 185/105 (BP Location: Right Arm)   Pulse 78  Temp 98.4 F (36.9 C) (Oral)   Resp 16   SpO2 97%   Visual Acuity Right Eye Distance:   Left Eye Distance:   Bilateral Distance:    Right Eye Near:   Left Eye Near:    Bilateral Near:     Physical Exam Vitals and nursing note reviewed.  Constitutional:      Appearance: Normal appearance.  HENT:     Head: Atraumatic.     Mouth/Throat:     Mouth: Mucous membranes are moist.     Comments: Slight ulcerations appreciated to the inner aspect of the lower lip Eyes:     Extraocular Movements: Extraocular movements intact.     Conjunctiva/sclera: Conjunctivae normal.  Cardiovascular:     Rate and Rhythm: Normal rate.  Pulmonary:     Effort: Pulmonary effort is normal.  Musculoskeletal:        General: Normal range of motion.     Cervical back: Normal range of motion and neck supple.  Skin:    General: Skin is warm and dry.  Neurological:     Mental Status: He is oriented to person, place, and time.  Psychiatric:        Mood and Affect: Mood normal.        Thought Content: Thought content  normal.        Judgment: Judgment normal.      UC Treatments / Results  Labs (all labs ordered are listed, but only abnormal results are displayed) Labs Reviewed - No data to display  EKG   Radiology No results found.  Procedures Procedures (including critical care time)  Medications Ordered in UC Medications - No data to display  Initial Impression / Assessment and Plan / UC Course  I have reviewed the triage vital signs and the nursing notes.  Pertinent labs & imaging results that were available during my care of the patient were reviewed by me and considered in my medical decision making (see chart for details).     Trial Peridex rinse, avoid abrasive toothpaste, mouth rinses, peroxide gargles and follow-up with dentist if not resolving.  Final Clinical Impressions(s) / UC Diagnoses   Final diagnoses:  Mouth ulcers   Discharge Instructions   None    ED Prescriptions     Medication Sig Dispense Auth. Provider   chlorhexidine (PERIDEX) 0.12 % solution Use as directed 15 mLs in the mouth or throat 2 (two) times daily. 120 mL Corbin Dess, [place]      PDMP not reviewed this encounter.   Tacy Expose [place], [place] 05/04/24 [phone]

## 2024-05-04 NOTE — ED Triage Notes (Addendum)
 Pt reports sores in the mouth that are not healing x 1 mo

## 2024-05-11 ENCOUNTER — Encounter: Payer: Self-pay | Admitting: Internal Medicine

## 2024-08-04 ENCOUNTER — Ambulatory Visit
Admission: EM | Admit: 2024-08-04 | Discharge: 2024-08-04 | Disposition: A | Discharge status: Home / Self Care | Attending: Family Medicine | Admitting: Family Medicine

## 2024-08-04 DIAGNOSIS — J0100 Acute maxillary sinusitis, unspecified: Secondary | ICD-10-CM

## 2024-08-04 DIAGNOSIS — J3089 Other allergic rhinitis: Secondary | ICD-10-CM

## 2024-08-04 MED ORDER — AMOXICILLIN-POT CLAVULANATE 875-125 MG PO TABS: 1.0000 | ORAL_TABLET | Freq: Two times a day (BID) | ORAL | 0 refills | Status: AC

## 2024-08-04 MED ORDER — AZELASTINE HCL 0.1 % NA SOLN: 1.0000 | Freq: Two times a day (BID) | NASAL | 0 refills | Status: AC

## 2024-08-04 MED ORDER — CETIRIZINE HCL 10 MG PO TABS: 10.0000 mg | ORAL_TABLET | Freq: Every day | ORAL | 2 refills | Status: AC

## 2024-08-04 NOTE — ED Provider Notes (Signed)
 [facility]    CSN: [id number] Arrival date & time: 08/04/24  1542      History   Chief Complaint No chief complaint on file.   HPI Jared Knight is a 63 y.o. male.   Patient presenting today with about 10-day history of nasal congestion, fatigue, nausea, body aches, sinus pressure and pain.  Denies fever, chills, chest pain, shortness of breath, abdominal pain, vomiting, or diarrhea.  So far trying the occasional sinus pill, over-the-counter cough syrup and ibuprofen with mild temporary benefit.  History of seasonal allergies not currently on anything for this.  No known sick contacts recently.   Past Medical History:  Diagnosis Date   Allergy    Arthritis    Hemorrhoids    Patient Active Problem List   Diagnosis Date Noted   Prolapsed internal hemorrhoids, grade 2 06/24/2023    History reviewed. No pertinent surgical history.    Home Medications    Prior to Admission medications   Medication Sig Start Date End Date Taking? Authorizing Provider  amoxicillin -clavulanate (AUGMENTIN ) 875-125 MG tablet Take 1 tablet by mouth every 12 (twelve) hours. 08/04/24  Yes Stuart Vernell Norris, PA-C  azelastine  (ASTELIN ) 0.1 % nasal spray Place 1 spray into both nostrils 2 (two) times daily. Use in each nostril as directed 08/04/24  Yes Stuart Vernell Norris, PA-C  cetirizine  (ZYRTEC  ALLERGY) 10 MG tablet Take 1 tablet (10 mg total) by mouth daily. 08/04/24  Yes Stuart Vernell Norris, PA-C  acyclovir (ZOVIRAX) 400 MG tablet Take 400 mg by mouth 2 (two) times daily.    [provider]  chlorhexidine  (PERIDEX ) 0.12 % solution Use as directed 15 mLs in the mouth or throat 2 (two) times daily. 05/04/24   Stuart Vernell Norris, PA-C  Elastic Bandages & Supports (MEDICAL COMPRESSION SOCKS) MISC Please provide medical compression socks as needed. DX code I83.90. 08/09/23   Shirlean Therisa ORN, NP  ibuprofen (ADVIL) 200 MG tablet Take 600 mg by mouth. One bid prn    [provider]  OVER THE COUNTER MEDICATION Apple cide vinegar 3 oz one time daily B complex supplement with b12 once daily Multi vit one daily Calcium 600mg  plus vit D3 20 mcg bid  Fish oil 1000 mg one bid    [provider]  psyllium (METAMUCIL) 58.6 % powder Take 1 packet by mouth daily. Taking 1 1/4 tsp daily    [provider]  valsartan (DIOVAN) 40 MG tablet Take 40 mg by mouth daily.    [provider]    Family History History reviewed. No pertinent family history.  Social History Social History   Tobacco Use   Smoking status: Never    Passive exposure: Never   Smokeless tobacco: Never  Vaping Use   Vaping status: Never Used  Substance Use Topics   Alcohol use: No   Drug use: No     Allergies   Nicotine and Dust mite extract   Review of Systems Review of Systems Per HPI  Physical Exam Triage Vital Signs ED Triage Vitals  Encounter Vitals Group     BP 08/04/24 1611 (!) 156/82     Girls Systolic BP Percentile --      Girls Diastolic BP Percentile --      Boys Systolic BP Percentile --      Boys Diastolic BP Percentile --      Pulse Rate 08/04/24 1611 72     Resp 08/04/24 1611 16  Temp 08/04/24 1611 97.6 F (36.4 C)     Temp Source 08/04/24 1611 Oral     SpO2 08/04/24 1611 96 %     Weight --      Height --      Head Circumference --      Peak Flow --      Pain Score 08/04/24 1614 0     Pain Loc --      Pain Education --      Exclude from Growth Chart --    No data found.  Updated Vital Signs BP (!) 156/82 (BP Location: Right Arm)   Pulse 72   Temp 97.6 F (36.4 C) (Oral)   Resp 16   SpO2 96%   Visual Acuity Right Eye Distance:   Left Eye Distance:   Bilateral Distance:    Right Eye Near:   Left Eye Near:    Bilateral Near:     Physical Exam Vitals and nursing note reviewed.  Constitutional:      Appearance: He is well-developed.  HENT:     Head: Atraumatic.     Right Ear: Tympanic membrane and  external ear normal.     Left Ear: Tympanic membrane and external ear normal.     Nose: Congestion present.     Mouth/Throat:     Pharynx: Posterior oropharyngeal erythema present. No oropharyngeal exudate.  Eyes:     Conjunctiva/sclera: Conjunctivae normal.     Pupils: Pupils are equal, round, and reactive to light.  Cardiovascular:     Rate and Rhythm: Normal rate and regular rhythm.  Pulmonary:     Effort: Pulmonary effort is normal. No respiratory distress.     Breath sounds: No wheezing or rales.  Musculoskeletal:        General: Normal range of motion.     Cervical back: Normal range of motion and neck supple.  Lymphadenopathy:     Cervical: No cervical adenopathy.  Skin:    General: Skin is warm and dry.  Neurological:     Mental Status: He is alert and oriented to person, place, and time.  Psychiatric:        Behavior: Behavior normal.      UC Treatments / Results  Labs (all labs ordered are listed, but only abnormal results are displayed) Labs Reviewed - No data to display  EKG   Radiology No results found.  Procedures Procedures (including critical care time)  Medications Ordered in UC Medications - No data to display  Initial Impression / Assessment and Plan / UC Course  I have reviewed the triage vital signs and the nursing notes.  Pertinent labs & imaging results that were available during my care of the patient were reviewed by me and considered in my medical decision making (see chart for details).     Suspect uncontrolled seasonal allergies to be causing symptoms.  Treat with Zyrtec , Astelin  and will prescribe Augmentin  in case symptoms progressively worsen over the next few days given duration of symptoms.  Discussed supportive over-the-counter medications, home care and return precautions.  Final Clinical Impressions(s) / UC Diagnoses   Final diagnoses:  Acute non-recurrent maxillary sinusitis  Seasonal allergic rhinitis due to other  allergic trigger     Discharge Instructions      I have prescribed a good nasal spray, allergy medication and you may take Coricidin HBP, plain Mucinex, use saline sinus rinses and humidifiers.  If worsening or not improving over the next few days may start  Augmentin .  Return for significantly worsening symptoms.    ED Prescriptions     Medication Sig Dispense Auth. Provider   cetirizine  (ZYRTEC  ALLERGY) 10 MG tablet Take 1 tablet (10 mg total) by mouth daily. 30 tablet Stuart Vernell Norris, PA-C   azelastine  (ASTELIN ) 0.1 % nasal spray Place 1 spray into both nostrils 2 (two) times daily. Use in each nostril as directed 30 mL Stuart Vernell Norris, PA-C   amoxicillin -clavulanate (AUGMENTIN ) 875-125 MG tablet Take 1 tablet by mouth every 12 (twelve) hours. 14 tablet Stuart Vernell Norris, [place]      PDMP not reviewed this encounter.   Stuart Vernell Norris, [place] 08/04/24 1719

## 2024-08-04 NOTE — Discharge Instructions (Signed)
 I have prescribed a good nasal spray, allergy medication and you may take Coricidin HBP, plain Mucinex, use saline sinus rinses and humidifiers.  If worsening or not improving over the next few days may start Augmentin .  Return for significantly worsening symptoms.

## 2024-08-04 NOTE — ED Triage Notes (Signed)
 Pt reports he has nasal congestion, fatigue, upset stomach, and body aches x 10 days. States it is not consistent  Took sinus pill, ibuprofen, and tussin ex.

## 2024-08-19 ENCOUNTER — Other Ambulatory Visit: Payer: Self-pay

## 2024-09-06 ENCOUNTER — Telehealth: Payer: Self-pay

## 2024-09-06 DIAGNOSIS — M6289 Other specified disorders of muscle: Secondary | ICD-10-CM

## 2024-09-06 NOTE — Telephone Encounter (Signed)
 Sent Dr Cindie a secure chat regarding pt wanting results of his endo anorectal manometry. Waiting on a response

## 2024-09-07 NOTE — Telephone Encounter (Signed)
Phoned and LMOVM for the pt to return call 

## 2024-09-07 NOTE — Telephone Encounter (Signed)
 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 He may benefit from biofeedback therapy. If patient is agreeable, please have Mindy make referral. Thank you

## 2024-09-12 NOTE — Telephone Encounter (Signed)
 Returned the pt's call, no ans. LMOVM to return call

## 2024-09-13 NOTE — Telephone Encounter (Signed)
 Referral placed for biofeedback

## 2024-09-13 NOTE — Telephone Encounter (Signed)
 Spoke with the pt and advised of his result note regarding his endo anorectal manometry. Pt expressed could this be done sooner than later because he wants to get this taken care of. Also he asks if there are any specific exercises he can do in the mean time. If so please advise  Sent Dr Cindie this in secure chat.

## 2024-09-13 NOTE — Telephone Encounter (Signed)
 Response from Dr Cindie:  he can perform these at home. Just copy and paste link and print out or mail to him  CC [email or web address]   Printed the exercises. Referral has been made for the pt to biofeedback therapy.   Phoned and advised the pt of the above and put exercises in the mail (per request of pt).

## 2024-09-13 NOTE — Addendum Note (Signed)
 Addended by: JEANELL GRAEME RAMAN on: 09/13/2024 03:06 PM   Modules accepted: Orders

## 2024-11-01 NOTE — Therapy (Signed)
 OUTPATIENT PHYSICAL THERAPY MALE PELVIC EVALUATION   Patient Name: Jared Knight MRN: [id number] DOB:12-Dec-1961, 63 y.o., male Today's Date: 11/02/2024  END OF SESSION:  PT End of Session - 11/02/24 1645     Visit Number 1    Date for Recertification  05/02/25    Authorization Type [facility] med[facility]    Authorization Time Period waiting for auth    PT Start Time 1445    PT Stop Time 1530    PT Time Calculation (min) 45 min    Activity Tolerance Patient tolerated treatment well    Behavior During Therapy [facility] for tasks assessed/performed          Past Medical History:  Diagnosis Date   Allergy    Arthritis    Hemorrhoids    History reviewed. No pertinent surgical history. Patient Active Problem List   Diagnosis Date Noted   Prolapsed internal hemorrhoids, grade 2 06/24/2023    PCP: Rosamond Leta NOVAK, MD  REFERRING PROVIDER: Cindie Carlin POUR, DO  REFERRING DIAG: [phone] (ICD-10-CM) - Pelvic floor dysfunct  THERAPY DIAG:  Other lack of coordination  Rationale for Evaluation and Treatment: Rehabilitation  ONSET DATE: 2023  SUBJECTIVE:                                                                                                                                                                                           SUBJECTIVE STATEMENT: Patient reports that this has been going on for 2 years, he saw dr Cindie for hemorrhoids for banding. He thought it should take care of it, it did not, the sphincter.  He still has anal leakage.  He has been doing sphincter exercises for the floor. Anal leakage seems to be significantly better. Still has some in the morning. Eats 3 prunes and apples, banana and orange. Trying to eat a proper diet Usually has 5 bowl movements/ day bristol stool scale 4-7 Does not strain Does 50 leg lifts and 50 sit ups 3 times/ week    Fluid intake: 2 gallons/ day  PAIN:  Are you having pain? Yes- left shoulder and right knee, legs, hip NPRS  scale: 6/10 Pain location: no  Pain type: aching Pain description: constant   Aggravating factors: - Relieving factors: ibuprofen, ice  PRECAUTIONS: None  RED FLAGS: None   WEIGHT BEARING RESTRICTIONS: No  FALLS:  Has patient fallen in last 6 months? No  LIVING ENVIRONMENT: Lives with: lives with their spouse Lives in: House/apartment Stairs: No Has following equipment at home: None  OCCUPATION: vending business and landlords  PLOF: Independent  PATIENT GOALS: best health he can possibly can  PERTINENT HISTORY:   Sexual abuse: no  BOWEL MOVEMENT: Pain with bowel movement: No Type of bowel movement:Type (Bristol Stool Scale) 4-7 Fully empty rectum: No  Leakage: Yes: some- wetness and irritation Pads: No Fiber supplement: No- just the fruit  URINATION: no, goes more frequently  INTERCOURSE: no issues   OBJECTIVE:  Note: Objective measures were completed at Evaluation unless otherwise noted.  DIAGNOSTIC FINDINGS:    PATIENT SURVEYS:    PFIQ-7 133    [id number]  COGNITION: Overall cognitive status: Within functional limits for tasks assessed     SENSATION: Light touch: Appears intact Proprioception: Appears intact  LUMBAR SPECIAL TESTS:  Straight leg raise test: Negative  FUNCTIONAL TESTS:    GAIT: Distance walked: 20 Assistive device utilized: None Level of assistance: Complete Independence Comments: stooped posture  POSTURE: rounded shoulders and forward head, flexed trunk  PELVIC ALIGNMENT: even  LUMBARAROM/PROM: mild stiffness throughout  A/PROM A/PROM  eval  Flexion   Extension   Right lateral flexion   Left lateral flexion   Right rotation   Left rotation    (Blank rows = not tested)  LOWER EXTREMITY AROM/PROM: to be assessed  A/PROM Right eval Left eval  Hip flexion    Hip extension    Hip abduction    Hip adduction    Hip internal rotation    Hip external rotation    Knee flexion    Knee extension    Ankle  dorsiflexion    Ankle plantarflexion    Ankle inversion    Ankle eversion     (Blank rows = not tested)  LOWER EXTREMITY MMT: 4/5 at least overall   MMT Right eval Left eval  Hip flexion    Hip extension    Hip abduction    Hip adduction    Hip internal rotation    Hip external rotation    Knee flexion    Knee extension    Ankle dorsiflexion    Ankle plantarflexion    Ankle inversion    Ankle eversion     PALPATION: GENERAL abdominal diastasis with doming              External Perineal Exam to be assessed              Internal Pelvic Floor to be assessed Patient confirms identification and approves PT to assess internal pelvic floor and treatment No  PELVIC MMT:   MMT eval  Internal Anal Sphincter   External Anal Sphincter   Puborectalis   Diastasis Recti Yes 2 fingers with doming  (Blank rows = not tested)  TONE: To be assessed  TODAY'S TREATMENT:                                                                                                                              DATE: 10/2024  EVAL  Examination completed, findings reviewed, pt educated on POC, HEP, and male pelvic floor anatomy, reasoning with  pelvic floor assessment internally with pt consent. Pt motivated to participate in PT and agreeable to attempt recommendations.     PATIENT EDUCATION:  Education details: Pt was educated on relevant anatomy, exam findings, home exercise program, plan of care, expectations of PT and soluble fiber to help with stool consistency Person educated: Patient Education method: Explanation, Demonstration, Tactile cues, Verbal cues, and Handouts Education comprehension: verbalized understanding, returned demonstration, verbal cues required, tactile cues required, and needs further education  HOME EXERCISE PROGRAM: Access Code: [email or web address] URL: [email or web address] Date: 11/02/2024 Prepared by: Cori Jahnay Lantier  Exercises - Supine Bridge with Pelvic Floor  Contraction  - 1 x daily - 7 x weekly - 2 sets - 10 reps - Diaphragmatic Breathing to Reduce Intra-abdominal Pressure: Lifting a Basket  - 1 x daily - 7 x weekly - 2 sets - 10 reps - Sit to Stand with Pelvic Floor Contraction  - 1 x daily - 7 x weekly - 2 sets - 10 reps - Seated Exhale with Pelvic Floor Contraction and Hand to Mouth  - 1 x daily - 7 x weekly - 2 sets - 10 reps  Patient Education - High-Fiber Diet to Support Pelvic Health - Bowel Emptying Techniques  ASSESSMENT:  CLINICAL IMPRESSION: Patient is a 63 y.o. M who was seen today for physical therapy evaluation and treatment for pelvic floor dysfunction. Patient reports that symptoms have been going on for about 2 years, now he has some diarrhea. Exam findings are notable for breath holding strategies, abdominal diastasis, pelvic floor muscle good contraction ( palpated externally today) Patient demonstrates flexed trunk , bilateral hip fair strength, and pain in bilateral knees and shoulders. It is difficult for patient to coordinate abdominal contraction and pelvic floor with breathing today. He has been doing his exercises separately.  Discussed findings with patient, educated patient on soluble fiber and HEP was initiated. Patient's quality of life has been affected, patient is frustrated and will benefit from physical therapy to address deficits, reduce fecal smearing and improve quality of life.   OBJECTIVE IMPAIRMENTS: Abnormal gait, impaired tone, and pain.   ACTIVITY LIMITATIONS: toileting  PARTICIPATION LIMITATIONS: community activity  PERSONAL FACTORS: Time since onset of injury/illness/exacerbation are also affecting patient's functional outcome.   REHAB POTENTIAL: Good  CLINICAL DECISION MAKING: Evolving/moderate complexity  EVALUATION COMPLEXITY: Moderate   GOALS: Goals reviewed with patient? Yes  SHORT TERM GOALS: Target date: 11/30/2024    Patient will report bristol stool scale 3-4 to reduce the risk of  fecal leakage Baseline:4-7 Goal status: INITIAL  2.  Patient will engage his pelvic floor and lower abdomen with exhale without breath holding during his HEP and in PT session Baseline:  Goal status: INITIAL    LONG TERM GOALS: Target date: 05/02/2024  Patient will report no fecal smearing in order to be able to participate in community without embarrassment  Baseline:  Goal status: INITIAL  2.  Pt will demonstrate 20 point improvement in PFIQ-7 score in order to show functional improvement in fecal incontinence.   Baseline: 137 Goal status: INITIAL  3.  Pt will be I and consistent with her HEP and demonstrate all exercises correctly  Baseline:  Goal status: INITIAL  4.  Patient will report complete bowel emptying with his bowel movements Baseline:  Goal status: INITIAL  5.  Patient will be I with abdominal massage Baseline:  Goal status: INITIAL    PLAN:  PT FREQUENCY: 1-2x/week  PT DURATION: 6 months  PLANNED INTERVENTIONS: 97110-Therapeutic exercises, 97530-  Therapeutic activity, [id number]- Neuromuscular re-education, [phone]- Self Care, 02859- Manual therapy, [phone]- Electrical stimulation (manual), [phone]- Ionotophoresis 4mg /ml Dexamethasone , 20560 (1-2 muscles), 20561 (3+ muscles)- Dry Needling, Patient/Family education, Taping, Joint mobilization, Joint manipulation, Spinal manipulation, Spinal mobilization, Scar mobilization, Cryotherapy, Moist heat, and Biofeedback  PLAN FOR NEXT SESSION: bowel/ food diary, teach abdominal massage, give him bowel routine, possible internal rectal exam, core coordination exercises with exhale   Arsema Tusing, PT 11/02/2024, 4:47 PM

## 2024-11-02 ENCOUNTER — Encounter: Payer: Self-pay | Admitting: Physical Therapy

## 2024-11-02 ENCOUNTER — Other Ambulatory Visit: Payer: Self-pay

## 2024-11-02 ENCOUNTER — Ambulatory Visit: Attending: Internal Medicine | Admitting: Physical Therapy

## 2024-11-02 DIAGNOSIS — M6289 Other specified disorders of muscle: Secondary | ICD-10-CM | POA: Diagnosis not present

## 2024-11-02 DIAGNOSIS — R278 Other lack of coordination: Secondary | ICD-10-CM | POA: Diagnosis present

## 2024-11-08 ENCOUNTER — Ambulatory Visit

## 2024-11-14 ENCOUNTER — Ambulatory Visit: Payer: Self-pay | Admitting: Physical Therapy

## 2025-01-10 ENCOUNTER — Other Ambulatory Visit (INDEPENDENT_AMBULATORY_CARE_PROVIDER_SITE_OTHER): Payer: Self-pay

## 2025-01-10 ENCOUNTER — Ambulatory Visit: Admitting: Orthopedic Surgery

## 2025-01-10 DIAGNOSIS — G8929 Other chronic pain: Secondary | ICD-10-CM | POA: Diagnosis not present

## 2025-01-10 DIAGNOSIS — M25512 Pain in left shoulder: Secondary | ICD-10-CM | POA: Diagnosis not present

## 2025-01-11 ENCOUNTER — Encounter: Payer: Self-pay | Admitting: Orthopedic Surgery

## 2025-01-11 NOTE — Progress Notes (Signed)
 "  Office Visit Note   Patient: Jared Knight           Date of Birth: 1961-07-28           MRN: [id number] Visit Date: 01/10/2025 Requested by: Rosamond Leta NOVAK, MD [address] [place],  [place] [address] PCP: Rosamond Leta NOVAK, MD  Subjective: Chief Complaint  Patient presents with   Left Shoulder - Pain    HPI: Jared Knight is a 64 y.o. male who presents to the office reporting all left shoulder pain.  Patient has pain anteriorly around the bicipital groove as well as around the anterior superior rotator cuff.  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  The pain is primarily anterior.  Tucking his shirt is painful.  He has been doing exercises and weights.  Diclofenac  increases his blood pressure.  Does take ibuprofen which is better tolerated.  Does not report much in the way of mechanical symptoms.  Does have a history of open distal clavicle excision and subacromial decompression in 2003..                ROS: All systems reviewed are negative as they relate to the chief complaint within the history of present illness.  Patient denies fevers or chills.  Assessment & Plan: Visit Diagnoses:  1. Chronic left shoulder pain     Plan: Impression is left shoulder pain.  I think Roan may have some rotator cuff pathology.  Has a little bit of crepitus with passive range of motion.  Has a history of prior surgery.  Plan MRI arthrogram left shoulder to evaluate biceps tendon pathology versus bursitis.  Follow-up after that study.  Follow-Up Instructions: No follow-ups on file.   Orders:  Orders Placed This Encounter  Procedures   XR Shoulder Left   MR Shoulder Left w/ contrast   Arthrogram   No orders of the defined types were placed in this encounter.     Procedures: No procedures performed   Clinical Data: No additional findings.  Objective: Vital Signs: There were no vitals taken for this visit.  Physical  Exam:  Constitutional: Patient appears well-developed HEENT:  Head: Normocephalic Eyes:EOM are normal Neck: Normal range of motion Cardiovascular: Normal rate Pulmonary/chest: Effort normal Neurologic: Patient is alert Skin: Skin is warm Psychiatric: Patient has normal mood and affect  Ortho Exam: Ortho exam demonstrates range of motion of 60/100/165.  Rotator cuff strength is intact infraspinatus supraspinatus and subscap muscle testing.  No discrete AC joint tenderness present.  A little bit of crepitus anteriorly with internal and external rotation 9 degrees abduction.  O'Brien's testing equivocal on the left negative on the right.  Cervical spine range of motion full.  Motor or sensory function to the left arm intact with no paresthesias.  Specialty Comments:  No specialty comments available.  Imaging: XR Shoulder Left Result Date: 01/11/2025 AP outlet axillary lateral radiographs left shoulder reviewed.  No acute fracture.  Shoulder is located.  Acromiohumeral distance is normal.  No significant degenerative changes in the glenohumeral or AC joint.  Visualized lung fields clear.     PMFS History: Patient Active Problem List   Diagnosis Date Noted   Prolapsed internal hemorrhoids, grade 2 06/24/2023   Past Medical History:  Diagnosis Date   Allergy    Arthritis    Hemorrhoids     No family history on  file.  No past surgical history on file. Social History   Occupational History   Not on file  Tobacco Use   Smoking status: Never    Passive exposure: Never   Smokeless tobacco: Never  Vaping Use   Vaping status: Never Used  Substance and Sexual Activity   Alcohol use: No   Drug use: No   Sexual activity: Yes        "

## 2025-01-16 ENCOUNTER — Telehealth: Payer: Self-pay | Admitting: Orthopedic Surgery

## 2025-01-17 NOTE — Telephone Encounter (Signed)
 I tried calling pt back, left vm.   I believe the insurance is stating they dont cover DRI because I believe they dont have it as Veterinary surgeon ( same facility).  Pt insurance did approve for DRI ([facility]).

## 2025-01-31 ENCOUNTER — Ambulatory Visit: Admitting: Orthopedic Surgery

## 2025-02-05 ENCOUNTER — Other Ambulatory Visit
